# Patient Record
Sex: Female | Born: 1995
Health system: Southern US, Community
[De-identification: ages and names within clinical notes are randomized; demographics above are authoritative.]

## PROBLEM LIST (undated history)

## (undated) DIAGNOSIS — Z789 Other specified health status: Secondary | ICD-10-CM

## (undated) HISTORY — DX: Other specified health status: Z78.9

## (undated) HISTORY — PX: WISDOM TOOTH EXTRACTION: SHX21

---

## 2015-08-17 ENCOUNTER — Emergency Department
Admission: EM | Admit: 2015-08-17 | Discharge: 2015-08-17 | Disposition: A | Discharge status: Home / Self Care | Payer: No Typology Code available for payment source | Attending: Emergency Medicine | Admitting: Emergency Medicine

## 2015-08-17 ENCOUNTER — Emergency Department: Payer: No Typology Code available for payment source

## 2015-08-17 ENCOUNTER — Encounter: Payer: Self-pay | Admitting: *Deleted

## 2015-08-17 DIAGNOSIS — M545 Low back pain, unspecified: Secondary | ICD-10-CM

## 2015-08-17 DIAGNOSIS — Y998 Other external cause status: Secondary | ICD-10-CM | POA: Insufficient documentation

## 2015-08-17 DIAGNOSIS — Y92410 Unspecified street and highway as the place of occurrence of the external cause: Secondary | ICD-10-CM | POA: Insufficient documentation

## 2015-08-17 DIAGNOSIS — Y9389 Activity, other specified: Secondary | ICD-10-CM | POA: Insufficient documentation

## 2015-08-17 DIAGNOSIS — Z3202 Encounter for pregnancy test, result negative: Secondary | ICD-10-CM | POA: Insufficient documentation

## 2015-08-17 DIAGNOSIS — S3992XA Unspecified injury of lower back, initial encounter: Secondary | ICD-10-CM | POA: Insufficient documentation

## 2015-08-17 DIAGNOSIS — M7918 Myalgia, other site: Secondary | ICD-10-CM

## 2015-08-17 LAB — POCT PREGNANCY, URINE: Preg Test, Ur: NEGATIVE

## 2015-08-17 MED ORDER — NAPROXEN 500 MG PO TABS: 500.0000 mg | ORAL_TABLET | Freq: Two times a day (BID) | ORAL | Status: AC

## 2015-08-17 MED ORDER — CYCLOBENZAPRINE HCL 5 MG PO TABS: 5.0000 mg | ORAL_TABLET | Freq: Three times a day (TID) | ORAL | Status: DC | PRN

## 2015-08-17 NOTE — Discharge Instructions (Signed)
Dolor de espalda en adultos  (Back Pain, Adult)  El dolor de espalda es muy frecuente en los adultos. La causa del dolor de espalda es rara vez peligrosa y el dolor a menudo mejora con el tiempo. Es posible que se desconozca la causa de esta afección. Algunas causas comunes son las siguientes:  · Distensión de los músculos o ligamentos que sostienen la columna vertebral.  · Desgaste (degeneración) de los discos vertebrales.  · Artritis.  · Lesiones directas en la espalda.  En muchas personas, el dolor de espalda es recurrente. Como rara vez es peligroso, las personas pueden aprender a manejar esta afección por sí mismas.  INSTRUCCIONES PARA EL CUIDADO EN EL HOGAR  Controle su dolor de espalda a fin de detectar algún cambio. Las siguientes indicaciones ayudarán a aliviar cualquier molestia que pueda sentir:  · Permanezca activo. Si permanece sentado o de pie en un mismo lugar durante mucho tiempo, se tensiona la espalda. No se siente, conduzca o permanezca de pie en un mismo lugar durante más de 30 minutos seguidos. Realice caminatas cortas en superficies planas tan pronto como le sea posible. Trate de caminar un poco más de tiempo cada día.  · Haga ejercicio regularmente como se lo haya indicado el médico. El ejercicio ayuda a que su espalda se cure más rápidamente. También ayuda a prevenir futuras lesiones al mantener los músculos fuertes y flexibles.  · No permanezca en la cama. Si hace reposo más de 1 a 2 días, puede demorar su recuperación.  · Preste atención a su cuerpo al inclinarse y levantarse. Las posiciones más cómodas son las que ejercen menos tensión en la espalda en recuperación. Siempre use técnicas apropiadas para levantar objetos, como por ejemplo:    Flexionar las rodillas.    Mantener la carga cerca del cuerpo.    No torcerse.  · Encuentre una posición cómoda para dormir. Use un colchón firme y recuéstese de costado con las rodillas ligeramente flexionadas. Si se recuesta sobre la espalda, coloque  una almohada debajo de las rodillas.  · Evite sentir ansiedad o estrés. El estrés aumenta la tensión muscular y puede empeorar el dolor de espalda. Es importante reconocer si se siente ansioso o estresado y aprender maneras de controlarlo, por ejemplo haciendo ejercicio.  · Tome los medicamentos solamente como se lo haya indicado el médico. Los medicamentos de venta libre para aliviar el dolor y la inflamación a menudo son los más eficaces. El médico puede recetarle relajantes musculares. Estos medicamentos ayudan a calmar el dolor de modo que pueda reanudar más rápidamente sus actividades normales y el ejercicio saludable.  · Aplique hielo sobre la zona lesionada.    Ponga el hielo en una bolsa plástica.    Coloque una toalla entre la piel y la bolsa de hielo.    Deje el hielo durante 20 minutos, 2 a 3 veces por día, durante los primeros 2 o 3 días. Después de eso, puede alternar el hielo y el calor para reducir el dolor y los espasmos.  · Mantenga un peso saludable. El exceso de peso ejerce presión adicional sobre la espalda y hace que resulte difícil mantener una buena postura.  SOLICITE ATENCIÓN MÉDICA SI:  · Siente un dolor que no se alivia con reposo o medicamentos.  · Siente mucho dolor que se extiende a las piernas o los glúteos.  · El dolor no mejora en una semana.  · Siente dolor por la noche.  · Pierde peso.  · Siente escalofríos o fiebre.  SOLICITE ATENCIÓN MÉDICA DE INMEDIATO SI:   ·   Tiene nuevos problemas para controlar la vejiga o los intestinos.  · Siente debilidad o adormecimiento inusuales en los brazos o en las piernas.  · Siente náuseas o vómitos.  · Siente dolor abdominal.  · Siente que va a desmayarse.     Esta información no tiene como fin reemplazar el consejo del médico. Asegúrese de hacerle al médico cualquier pregunta que tenga.     Document Released: 08/26/2005 Document Revised: 09/16/2014  Elsevier Interactive Patient Education ©2016 [employer]
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
## 2015-11-30 DIAGNOSIS — Z309 Encounter for contraceptive management, unspecified: Secondary | ICD-10-CM | POA: Insufficient documentation

## 2017-10-16 ENCOUNTER — Ambulatory Visit
Admission: RE | Admit: 2017-10-16 | Discharge: 2017-10-16 | Disposition: A | Discharge status: Home / Self Care | Payer: Commercial Managed Care - PPO | Source: Ambulatory Visit | Attending: Internal Medicine | Admitting: Internal Medicine

## 2017-10-16 ENCOUNTER — Other Ambulatory Visit: Payer: Self-pay | Admitting: Internal Medicine

## 2017-10-16 DIAGNOSIS — R109 Unspecified abdominal pain: Secondary | ICD-10-CM | POA: Diagnosis not present

## 2017-10-16 DIAGNOSIS — R93421 Abnormal radiologic findings on diagnostic imaging of right kidney: Secondary | ICD-10-CM | POA: Insufficient documentation

## 2017-10-16 DIAGNOSIS — R93422 Abnormal radiologic findings on diagnostic imaging of left kidney: Secondary | ICD-10-CM | POA: Insufficient documentation

## 2018-09-30 IMAGING — CT CT RENAL STONE PROTOCOL
3 of 4 series · 8 of 46 positions shown, 15 images · non-contrast
Comparison: None.

CLINICAL DATA: Left flank pain since [REDACTED].

EXAM:
CT ABDOMEN AND PELVIS WITHOUT CONTRAST
TECHNIQUE: Multidetector CT imaging of the abdomen and pelvis was performed
following the standard protocol without IV contrast.

[Series 5: lung bases · axial · 0.71mm/px · z∈[-516,-456]mm · 4 of 20 slices shown, 9 images]
[im 4/20  soft-tissue]
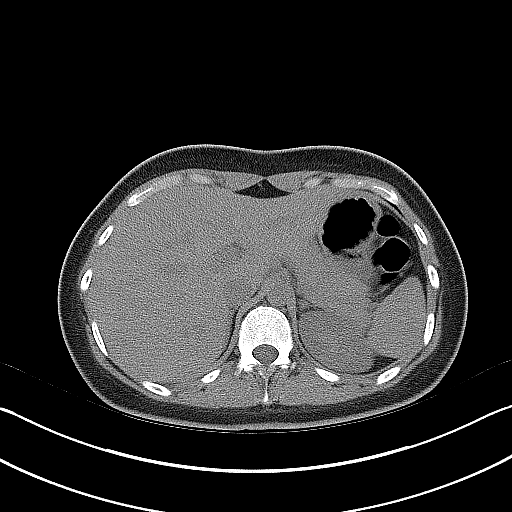
[im 4/20  lung]
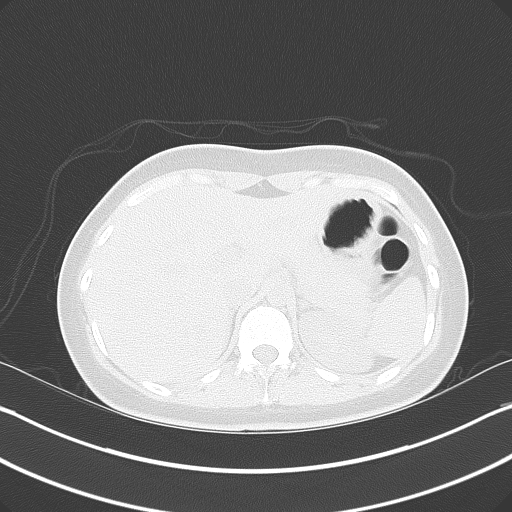
[im 4/20  bone]
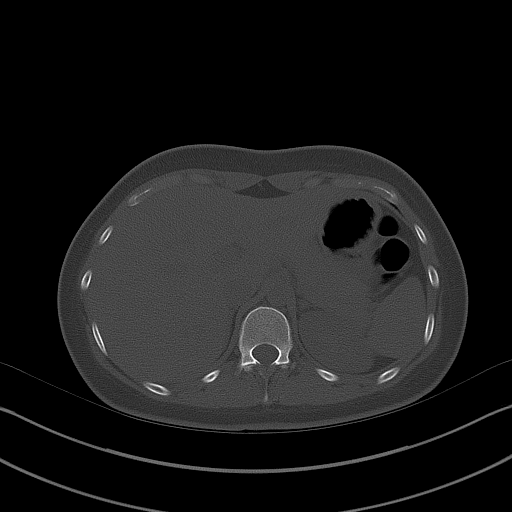
[im 8/20  soft-tissue]
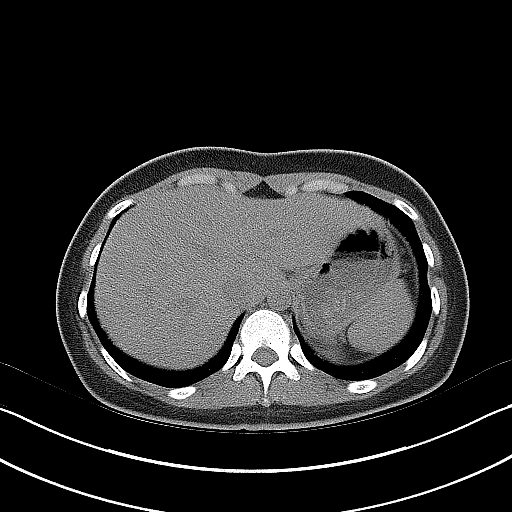
[im 8/20  lung]
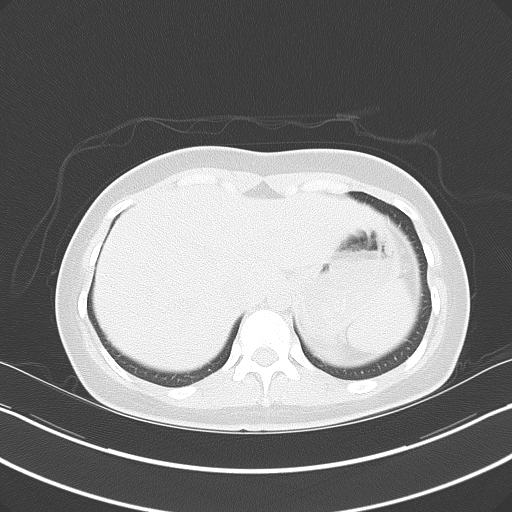
[im 12/20  soft-tissue]
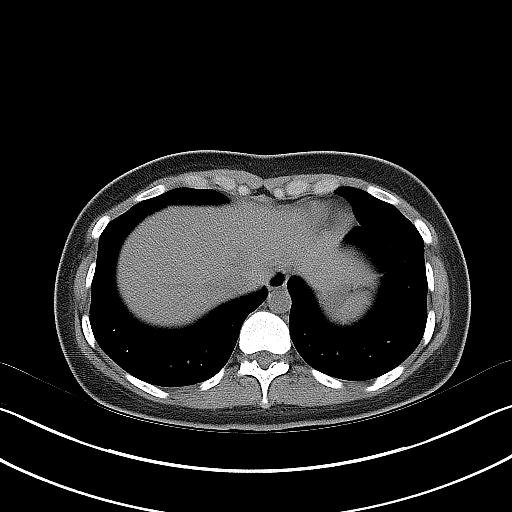
[im 12/20  lung]
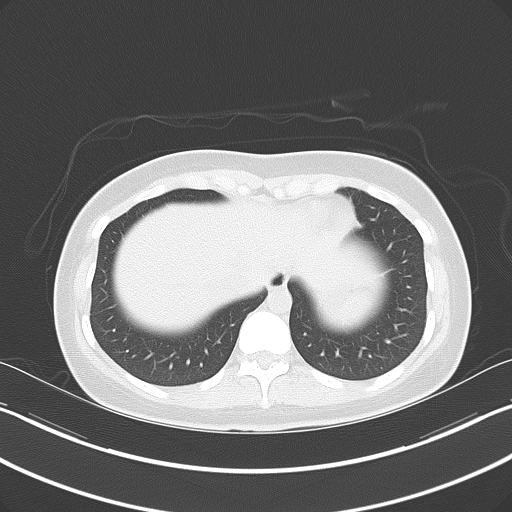
[im 16/20  soft-tissue]
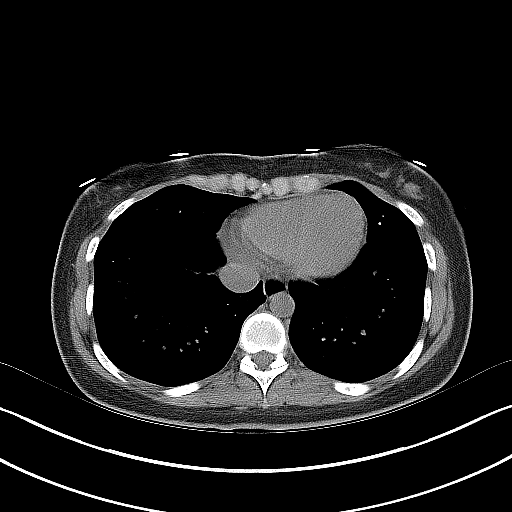
[im 16/20  lung]
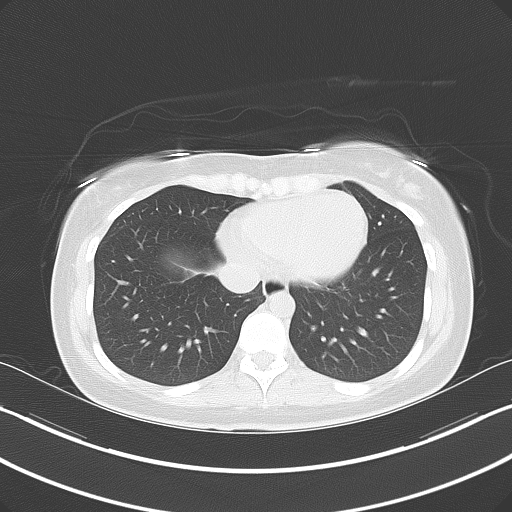

[Series 6: coronal · coronal · 0.74mm/px · 3 of 96 slices shown, 4 images]
[im 32/96  soft-tissue]
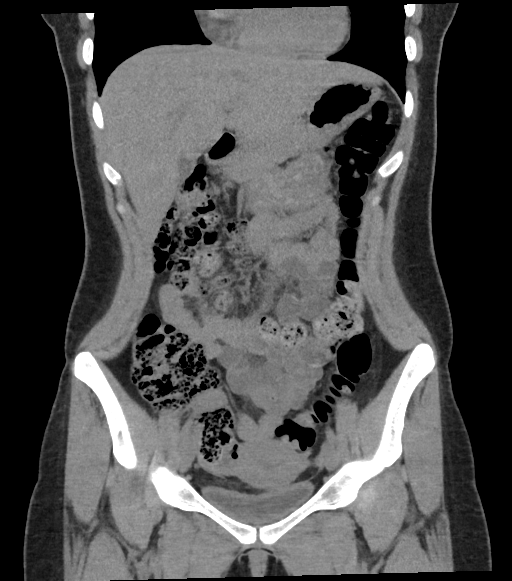
[im 43/96  soft-tissue]
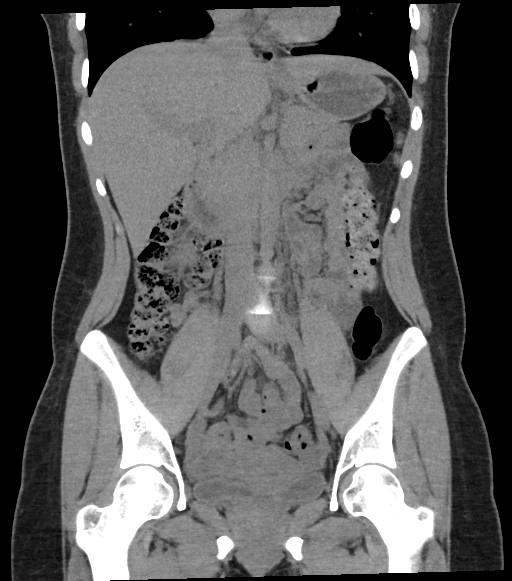
[im 43/96  bone]
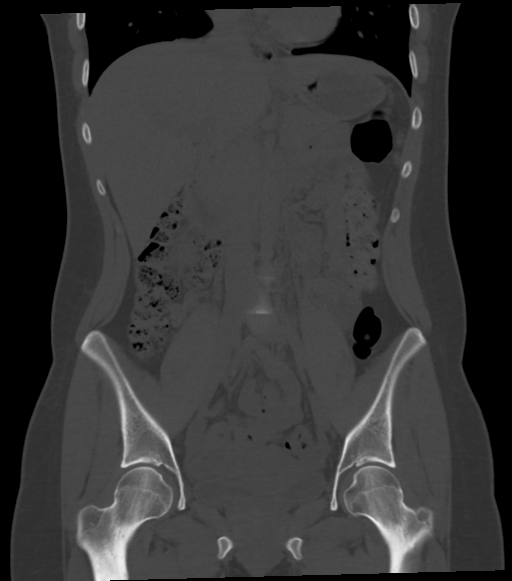
[im 53/96  soft-tissue]
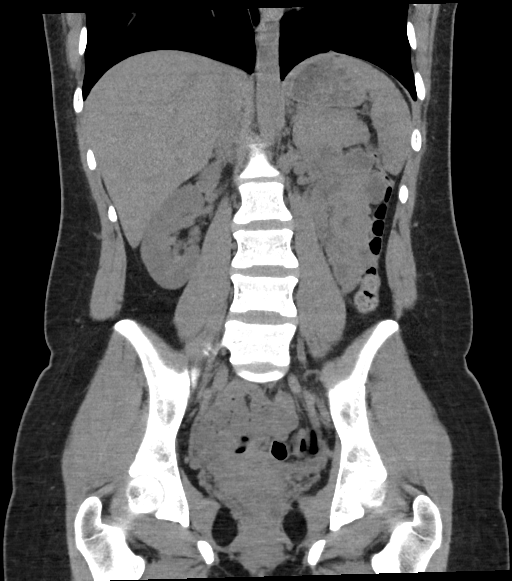

[Series 7: sagittal · sagittal · 0.42mm/px · 1 of 149 slices shown, 2 images]
[im 50/149  soft-tissue]
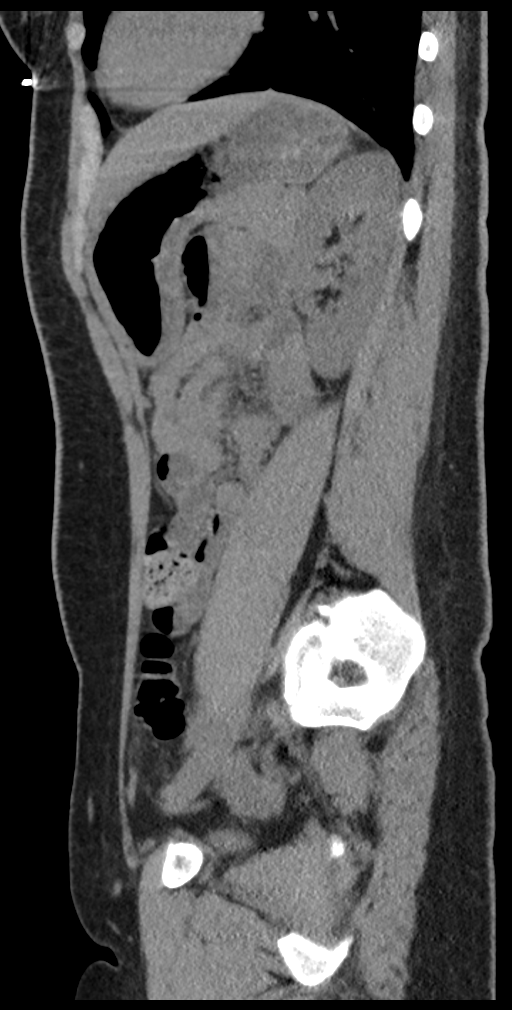
[im 50/149  bone]
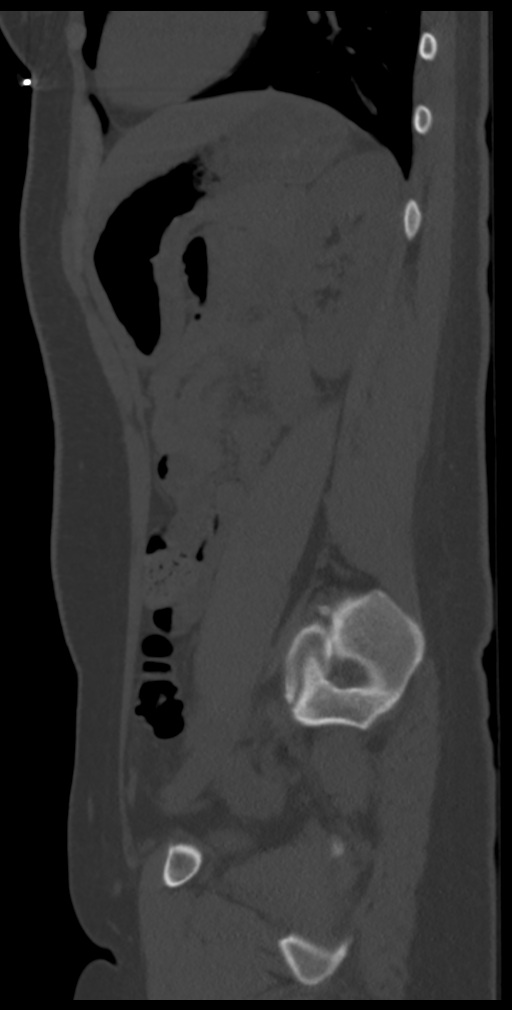

[8 of 46 positions shown; findings below may reference images not displayed]

FINDINGS: Lower chest: No acute abnormality.

Hepatobiliary: No focal liver abnormality is seen. No gallstones,
gallbladder wall thickening, or biliary dilatation.

Pancreas: Unremarkable. No pancreatic ductal dilatation or
surrounding inflammatory changes.

Spleen: Normal in size without focal abnormality.

Adrenals/Urinary Tract: Increased densities are identified in
bilateral renal medulla which may represent medullary calcinosis.
There is no hydronephrosis bilaterally. No focal renal stone is
identified in bilateral collecting systems. The bilateral adrenal
glands are normal. The bladder is normal.

Stomach/Bowel: Stomach is within normal limits. The appendix is not
seen but no inflammation around the cecum. No evidence of bowel wall
thickening, distention, or inflammatory changes.

Vascular/Lymphatic: No significant vascular findings are present. No
enlarged abdominal or pelvic lymph nodes.

Reproductive: Uterus and bilateral adnexa are unremarkable.

Other: None.

Musculoskeletal: No acute or significant osseous findings.
IMPRESSION: Increased densities are identified in bilateral renal medulla which
may represent medullary calcinosis. There is no hydronephrosis
bilaterally. No focal renal stone is identified in bilateral
collecting systems.

## 2020-07-04 ENCOUNTER — Ambulatory Visit (INDEPENDENT_AMBULATORY_CARE_PROVIDER_SITE_OTHER): Payer: Commercial Managed Care - PPO | Admitting: Obstetrics and Gynecology

## 2020-07-04 ENCOUNTER — Encounter: Payer: Self-pay | Admitting: Obstetrics and Gynecology

## 2020-07-04 ENCOUNTER — Other Ambulatory Visit (HOSPITAL_COMMUNITY)
Admission: RE | Admit: 2020-07-04 | Discharge: 2020-07-04 | Disposition: A | Discharge status: Home / Self Care | Payer: Commercial Managed Care - PPO | Source: Ambulatory Visit | Attending: Obstetrics and Gynecology | Admitting: Obstetrics and Gynecology

## 2020-07-04 ENCOUNTER — Other Ambulatory Visit: Payer: Self-pay

## 2020-07-04 VITALS — BP 108/70 | Ht 63.0 in | Wt 142.4 lb

## 2020-07-04 DIAGNOSIS — Z113 Encounter for screening for infections with a predominantly sexual mode of transmission: Secondary | ICD-10-CM | POA: Insufficient documentation

## 2020-07-04 DIAGNOSIS — Z0000 Encounter for general adult medical examination without abnormal findings: Secondary | ICD-10-CM | POA: Diagnosis not present

## 2020-07-04 DIAGNOSIS — Z01419 Encounter for gynecological examination (general) (routine) without abnormal findings: Secondary | ICD-10-CM

## 2020-07-04 NOTE — Progress Notes (Signed)
Gynecology Annual Exam  PCP: Leotis Shames, MD  Chief Complaint:  Chief Complaint  Patient presents with  . Gynecologic Exam    Annual Exam    History of Present Illness: Patient is a 24 y.o. No obstetric history on file. presents for annual exam. The patient has no complaints today.   LMP: Patient's last menstrual period was 06/09/2020. Average Interval: monthly Duration of flow: 5 days Heavy Menses: no Clots: no Intermenstrual Bleeding: no Postcoital Bleeding: reports spotting after intercourse one time.  Dysmenorrhea: no  The patient is sexually active. She currently uses OCP (estrogen/progesterone) for contraception. She denies dyspareunia.  The patient does perform self breast exams.  There is no notable family history of breast or ovarian cancer in her family.  The patient has regular exercise: cardio, 3 times a week.    The patient denies current symptoms of depression.    Review of Systems: Review of Systems  Constitutional: Negative for chills, fever, malaise/fatigue and weight loss.  HENT: Negative for congestion, hearing loss and sinus pain.   Eyes: Negative for blurred vision and double vision.  Respiratory: Negative for cough, sputum production, shortness of breath and wheezing.   Cardiovascular: Negative for chest pain, palpitations, orthopnea and leg swelling.  Gastrointestinal: Negative for abdominal pain, constipation, diarrhea, nausea and vomiting.  Genitourinary: Negative for dysuria, flank pain, frequency, hematuria and urgency.  Musculoskeletal: Negative for back pain, falls and joint pain.  Skin: Negative for itching and rash.  Neurological: Negative for dizziness and headaches.  Psychiatric/Behavioral: Negative for depression, substance abuse and suicidal ideas. The patient is not nervous/anxious.    Past Medical History:  There are no problems to display for this patient.  Past Surgical History:  History reviewed. No pertinent surgical  history.  Gynecologic History:  Patient's last menstrual period was 06/09/2020. Contraception: OCP (estrogen/progesterone) Last Pap: Results were: 2019  NIL   Obstetric History: No obstetric history on file.  Family History:  Family History  Problem Relation Age of Onset  . Hypertension Mother     Social History:  Social History   Socioeconomic History  . Marital status: Single    Spouse name: Not on file  . Number of children: Not on file  . Years of education: Not on file  . Highest education level: Not on file  Occupational History  . Not on file  Tobacco Use  . Smoking status: Never Smoker  . Smokeless tobacco: Never Used  Substance and Sexual Activity  . Alcohol use: Never  . Drug use: Never  . Sexual activity: Yes    Birth control/protection: Pill  Other Topics Concern  . Not on file  Social History Narrative  . Not on file   Social Determinants of Health   Financial Resource Strain:   . Difficulty of Paying Living Expenses: Not on file  Food Insecurity:   . Worried About Programme researcher, broadcasting/film/video in the Last Year: Not on file  . Ran Out of Food in the Last Year: Not on file  Transportation Needs:   . Lack of Transportation (Medical): Not on file  . Lack of Transportation (Non-Medical): Not on file  Physical Activity:   . Days of Exercise per Week: Not on file  . Minutes of Exercise per Session: Not on file  Stress:   . Feeling of Stress : Not on file  Social Connections:   . Frequency of Communication with Friends and Family: Not on file  . Frequency of Social Gatherings  with Friends and Family: Not on file  . Attends Religious Services: Not on file  . Active Member of Clubs or Organizations: Not on file  . Attends Banker Meetings: Not on file  . Marital Status: Not on file  Intimate Partner Violence:   . Fear of Current or Ex-Partner: Not on file  . Emotionally Abused: Not on file  . Physically Abused: Not on file  . Sexually Abused:  Not on file    Allergies:  No Known Allergies  Medications: Prior to Admission medications   Medication Sig Start Date End Date Taking? Authorizing Provider  Norgestimate-Ethinyl Estradiol Triphasic (TRI-LO-SPRINTEC) 0.18/0.215/0.25 MG-25 MCG tab Take 1 tablet by mouth daily. 04/17/20  Yes [provider]    Physical Exam Vitals: Blood pressure 108/70, height 5\' 3"  (1.6 m), weight 142 lb 6.4 oz (64.6 kg), last menstrual period 06/09/2020.  General: NAD HEENT: normocephalic, anicteric Thyroid: no enlargement, no palpable nodules Pulmonary: No increased work of breathing, CTAB Cardiovascular: RRR, distal pulses 2+ Breast: Declined Abdomen: NABS, soft, non-tender, non-distended.  Umbilicus without lesions.  No hepatomegaly, splenomegaly or masses palpable. No evidence of hernia  Genitourinary:  Declined Extremities: no edema, erythema, or tenderness Neurologic: Grossly intact Psychiatric: mood appropriate, affect full  Assessment: 24 y.o. No obstetric history on file. routine annual exam  Plan: Problem List Items Addressed This Visit    None    Visit Diagnoses    Encounter for annual routine gynecological examination    -  Primary   Health maintenance examination       Screen for STD (sexually transmitted disease)       Relevant Orders   Urine cytology ancillary only      1) STI screening  was offered and accepted  2) ASCCP guidelines and rational discussed.  Patient opts for every 3 years screening interval  3) Contraception - the patient is currently using  OCP (estrogen/progesterone).  She is happy with her current form of contraception and plans to continue  4) Routine healthcare maintenance including cholesterol, diabetes screening discussed managed by PCP  5) Return in about 1 year (around 07/04/2021) for annual.   07/06/2021 OB/GYN, [facility] 07/04/2020, 9:02 AM

## 2020-07-04 NOTE — Patient Instructions (Signed)
Exercising to Stay Healthy To become healthy and stay healthy, it is recommended that you do moderate-intensity and vigorous-intensity exercise. You can tell that you are exercising at a moderate intensity if your heart starts beating faster and you start breathing faster but can still hold a conversation. You can tell that you are exercising at a vigorous intensity if you are breathing much harder and faster and cannot hold a conversation while exercising. Exercising regularly is important. It has many health benefits, such as:  Improving overall fitness, flexibility, and endurance.  Increasing bone density.  Helping with weight control.  Decreasing body fat.  Increasing muscle strength.  Reducing stress and tension.  Improving overall health. How often should I exercise? Choose an activity that you enjoy, and set realistic goals. Your health care provider can help you make an activity plan that works for you. Exercise regularly as told by your health care provider. This may include:  Doing strength training two times a week, such as: ? Lifting weights. ? Using resistance bands. ? Push-ups. ? Sit-ups. ? Yoga.  Doing a certain intensity of exercise for a given amount of time. Choose from these options: ? A total of 150 minutes of moderate-intensity exercise every week. ? A total of 75 minutes of vigorous-intensity exercise every week. ? A mix of moderate-intensity and vigorous-intensity exercise every week. Children, pregnant women, people who have not exercised regularly, people who are overweight, and older adults may need to talk with a health care provider about what activities are safe to do. If you have a medical condition, be sure to talk with your health care provider before you start a new exercise program. What are some exercise ideas? Moderate-intensity exercise ideas include:  Walking 1 mile (1.6 km) in about 15  minutes.  Biking.  Hiking.  Golfing.  Dancing.  Water aerobics. Vigorous-intensity exercise ideas include:  Walking 4.5 miles (7.2 km) or more in about 1 hour.  Jogging or running 5 miles (8 km) in about 1 hour.  Biking 10 miles (16.1 km) or more in about 1 hour.  Lap swimming.  Roller-skating or in-line skating.  Cross-country skiing.  Vigorous competitive sports, such as football, basketball, and soccer.  Jumping rope.  Aerobic dancing. What are some everyday activities that can help me to get exercise?  Yard work, such as: ? Pushing a lawn mower. ? Raking and bagging leaves.  Washing your car.  Pushing a stroller.  Shoveling snow.  Gardening.  Washing windows or floors. How can I be more active in my day-to-day activities?  Use stairs instead of an elevator.  Take a walk during your lunch break.  If you drive, park your car farther away from your work or school.  If you take public transportation, get off one stop early and walk the rest of the way.  Stand up or walk around during all of your indoor phone calls.  Get up, stretch, and walk around every 30 minutes throughout the day.  Enjoy exercise with a friend. Support to continue exercising will help you keep a regular routine of activity. What guidelines can I follow while exercising?  Before you start a new exercise program, talk with your health care provider.  Do not exercise so much that you hurt yourself, feel dizzy, or get very short of breath.  Wear comfortable clothes and wear shoes with good support.  Drink plenty of water while you exercise to prevent dehydration or heat stroke.  Work out until your breathing   and your heartbeat get faster. Where to find more information  [place] Department of Health and Human Services: www.hhs.gov  Centers for Disease Control and Prevention (CDC): www.cdc.gov Summary  Exercising regularly is important. It will improve your overall fitness,  flexibility, and endurance.  Regular exercise also will improve your overall health. It can help you control your weight, reduce stress, and improve your bone density.  Do not exercise so much that you hurt yourself, feel dizzy, or get very short of breath.  Before you start a new exercise program, talk with your health care provider. This information is not intended to replace advice given to you by your health care provider. Make sure you discuss any questions you have with your health care provider. Document Revised: 08/08/2017 Document Reviewed: 07/17/2017 Elsevier Patient Education  2020 [employer]   Budget-Friendly Healthy Eating There are many ways to save money at the grocery store and continue to eat healthy. You can be successful if you:  Plan meals according to your budget.  Make a grocery list and only purchase food according to your grocery list.  Prepare food yourself. What are tips for following this plan?  Reading food labels  Compare food labels between brand name foods and the store brand. Often the nutritional value is the same, but the store brand is lower cost.  Look for products that do not have added sugar, fat, or salt (sodium). These often cost the same but are healthier for you. Products may be labeled as: ? Sugar-free. ? Nonfat. ? Low-fat. ? Sodium-free. ? Low-sodium.  Look for lean ground beef labeled as at least 92% lean and 8% fat. Shopping  Buy only the items on your grocery list and go only to the areas of the store that have the items on your list.  Use coupons only for foods and brands you normally buy. Avoid buying items you wouldn't normally buy simply because they are on sale.  Check online and in newspapers for weekly deals.  Buy healthy items from the bulk bins when available, such as herbs, spices, flour, pasta, nuts, and dried fruit.  Buy fruits and vegetables that are in season. Prices are usually lower on in-season  produce.  Look at the unit price on the price tag. Use it to compare different brands and sizes to find out which item is the best deal.  Choose healthy items that are often low-cost, such as carrots, potatoes, apples, bananas, and oranges. Dried or canned beans are a low-cost protein source.  Buy in bulk and freeze extra food. Items you can buy in bulk include meats, fish, poultry, frozen fruits, and frozen vegetables.  Avoid buying "ready-to-eat" foods, such as pre-cut fruits and vegetables and pre-made salads.  If possible, shop around to discover where you can find the best prices. Consider other retailers such as dollar stores, larger wholesale stores, local fruit and vegetable stands, and farmers markets.  Do not shop when you are hungry. If you shop while hungry, it may be hard to stick to your list and budget.  Resist impulse buying. Use your grocery list as your official plan for the week.  Buy a variety of vegetables and fruits by purchasing fresh, frozen, and canned items.  Look at the top and bottom shelves for deals. Foods at eye level (eye level of an adult or child) are usually more expensive.  Be efficient with your time when shopping. The more time you spend at the store, the more money you   are likely to spend.  To save money when choosing more expensive foods like meats and dairy: ? Choose cheaper cuts of meat, such as bone-in chicken thighs and drumsticks instead of skinless and boneless chicken. When you are ready to prepare the chicken, you can remove the skin yourself to make it healthier. ? Choose lean meats like chicken or turkey instead of beef. ? Choose canned seafood, such as tuna, salmon, or sardines. ? Buy eggs as a low-cost source of protein. ? Buy dried beans and peas, such as lentils, split peas, or kidney beans instead of meats. Dried beans and peas are a good alternative source of protein. ? Buy the larger tubs of yogurt instead of individual-sized  containers.  Choose water instead of sodas and other sweetened beverages.  Avoid buying chips, cookies, and other "junk food." These items are usually expensive and not healthy. Cooking  Make extra food and freeze the extras in meal-sized containers or in individual portions for fast meals and snacks.  Pre-cook on days when you have extra time to prepare meals in advance. You can keep these meals in the fridge or freezer and reheat for a quick meal.  When you come home from the grocery store, wash, peel, and cut fruits and vegetables so they are ready to use and eat. This will help reduce food waste. Meal planning  Do not eat out or get fast food. Prepare food at home.  Make a grocery list and make sure to bring it with you to the store. If you have a smart phone, you could use your phone to create your shopping list.  Plan meals and snacks according to a grocery list and budget you create.  Use leftovers in your meal plan for the week.  Look for recipes where you can cook once and make enough food for two meals.  Include budget-friendly meals like stews, casseroles, and stir-fry dishes.  Try some meatless meals or try "no cook" meals like salads.  Make sure that half your plate is filled with fruits or vegetables. Choose from fresh, frozen, or canned fruits and vegetables. If eating canned, remember to rinse them before eating. This will remove any excess salt added for packaging. Summary  Eating healthy on a budget is possible if you plan your meals according to your budget, purchase according to your budget and grocery list, and prepare food yourself.  Tips for buying more food on a limited budget include buying generic brands, using coupons only for foods you normally buy, and buying healthy items from the bulk bins when available.  Tips for buying cheaper food to replace expensive food include choosing cheaper, lean cuts of meat, and buying dried beans and peas. This  information is not intended to replace advice given to you by your health care provider. Make sure you discuss any questions you have with your health care provider. Document Revised: 08/27/2017 Document Reviewed: 08/27/2017 Elsevier Patient Education  2020 [employer]   Bone Health Bones protect organs, store calcium, anchor muscles, and support the whole body. Keeping your bones strong is important, especially as you get older. You can take actions to help keep your bones strong and healthy. Why is keeping my bones healthy important?  Keeping your bones healthy is important because your body constantly replaces bone cells. Cells get old, and new cells take their place. As we age, we lose bone cells because the body may not be able to make enough new cells to replace the   old cells. The amount of bone cells and bone tissue you have is referred to as bone mass. The higher your bone mass, the stronger your bones. The aging process leads to an overall loss of bone mass in the body, which can increase the likelihood of:  Joint pain and stiffness.  Broken bones.  A condition in which the bones become weak and brittle (osteoporosis). A large decline in bone mass occurs in older adults. In women, it occurs about the time of menopause. What actions can I take to keep my bones healthy? Good health habits are important for maintaining healthy bones. This includes eating nutritious foods and exercising regularly. To have healthy bones, you need to get enough of the right minerals and vitamins. Most nutrition experts recommend getting these nutrients from the foods that you eat. In some cases, taking supplements may also be recommended. Doing certain types of exercise is also important for bone health. What are the nutritional recommendations for healthy bones?  Eating a well-balanced diet with plenty of calcium and vitamin D will help to protect your bones. Nutritional recommendations vary from person  to person. Ask your health care provider what is healthy for you. Here are some general guidelines. Get enough calcium Calcium is the most important (essential) mineral for bone health. Most people can get enough calcium from their diet, but supplements may be recommended for people who are at risk for osteoporosis. Good sources of calcium include:  Dairy products, such as low-fat or nonfat milk, cheese, and yogurt.  Dark green leafy vegetables, such as bok choy and broccoli.  Calcium-fortified foods, such as orange juice, cereal, bread, soy beverages, and tofu products.  Nuts, such as almonds. Follow these recommended amounts for daily calcium intake:  Children, age 1-3: 700 mg.  Children, age 4-8: 1,000 mg.  Children, age 9-13: 1,300 mg.  Teens, age 14-18: 1,300 mg.  Adults, age 19-50: 1,000 mg.  Adults, age 51-70: ? Men: 1,000 mg. ? Women: 1,200 mg.  Adults, age 71 or older: 1,200 mg.  Pregnant and breastfeeding females: ? Teens: 1,300 mg. ? Adults: 1,000 mg. Get enough vitamin D Vitamin D is the most essential vitamin for bone health. It helps the body absorb calcium. Sunlight stimulates the skin to make vitamin D, so be sure to get enough sunlight. If you live in a cold climate or you do not get outside often, your health care provider may recommend that you take vitamin D supplements. Good sources of vitamin D in your diet include:  Egg yolks.  Saltwater fish.  Milk and cereal fortified with vitamin D. Follow these recommended amounts for daily vitamin D intake:  Children and teens, age 1-18: 600 international units.  Adults, age 50 or younger: 400-800 international units.  Adults, age 51 or older: 800-1,000 international units. Get other important nutrients Other nutrients that are important for bone health include:  Phosphorus. This mineral is found in meat, poultry, dairy foods, nuts, and legumes. The recommended daily intake for adult men and adult women is  700 mg.  Magnesium. This mineral is found in seeds, nuts, dark green vegetables, and legumes. The recommended daily intake for adult men is 400-420 mg. For adult women, it is 310-320 mg.  Vitamin K. This vitamin is found in green leafy vegetables. The recommended daily intake is 120 mg for adult men and 90 mg for adult women. What type of physical activity is best for building and maintaining healthy bones? Weight-bearing and strength-building activities are   important for building and maintaining healthy bones. Weight-bearing activities cause muscles and bones to work against gravity. Strength-building activities increase the strength of the muscles that support bones. Weight-bearing and muscle-building activities include:  Walking and hiking.  Jogging and running.  Dancing.  Gym exercises.  Lifting weights.  Tennis and racquetball.  Climbing stairs.  Aerobics. Adults should get at least 30 minutes of moderate physical activity on most days. Children should get at least 60 minutes of moderate physical activity on most days. Ask your health care provider what type of exercise is best for you. How can I find out if my bone mass is low? Bone mass can be measured with an X-ray test called a bone mineral density (BMD) test. This test is recommended for all women who are age 65 or older. It may also be recommended for:  Men who are age 70 or older.  People who are at risk for osteoporosis because of: ? Having bones that break easily. ? Having a long-term disease that weakens bones, such as kidney disease or rheumatoid arthritis. ? Having menopause earlier than normal. ? Taking medicine that weakens bones, such as steroids, thyroid hormones, or hormone treatment for breast cancer or prostate cancer. ? Smoking. ? Drinking three or more alcoholic drinks a day. If you find that you have a low bone mass, you may be able to prevent osteoporosis or further bone loss by changing your diet and  lifestyle. Where can I find more information? For more information, check out the following websites:  National Osteoporosis Foundation: www.nof.org/patients  National Institutes of Health: www.bones.nih.gov  International Osteoporosis Foundation: www.iofbonehealth.org Summary  The aging process leads to an overall loss of bone mass in the body, which can increase the likelihood of broken bones and osteoporosis.  Eating a well-balanced diet with plenty of calcium and vitamin D will help to protect your bones.  Weight-bearing and strength-building activities are also important for building and maintaining strong bones.  Bone mass can be measured with an X-ray test called a bone mineral density (BMD) test. This information is not intended to replace advice given to you by your health care provider. Make sure you discuss any questions you have with your health care provider. Document Revised: 09/22/2017 Document Reviewed: 09/22/2017 Elsevier Patient Education  2020 [employer]   

## 2020-07-04 NOTE — Progress Notes (Signed)
Pt here for her Annual Exam.

## 2020-07-05 LAB — URINE CYTOLOGY ANCILLARY ONLY
Chlamydia: NEGATIVE
Comment: NEGATIVE
Comment: NORMAL
Neisseria Gonorrhea: NEGATIVE

## 2021-05-15 ENCOUNTER — Other Ambulatory Visit: Payer: Self-pay | Admitting: Obstetrics and Gynecology

## 2021-05-15 DIAGNOSIS — O219 Vomiting of pregnancy, unspecified: Secondary | ICD-10-CM

## 2021-05-15 MED ORDER — ONDANSETRON 4 MG PO TBDP: 4.0000 mg | ORAL_TABLET | Freq: Four times a day (QID) | ORAL | 3 refills | Status: DC | PRN

## 2021-06-04 ENCOUNTER — Encounter: Payer: Self-pay | Admitting: Obstetrics and Gynecology

## 2021-06-04 ENCOUNTER — Other Ambulatory Visit (HOSPITAL_COMMUNITY)
Admission: RE | Admit: 2021-06-04 | Discharge: 2021-06-04 | Disposition: A | Discharge status: Home / Self Care | Payer: Commercial Managed Care - PPO | Source: Ambulatory Visit | Attending: Obstetrics and Gynecology | Admitting: Obstetrics and Gynecology

## 2021-06-04 ENCOUNTER — Other Ambulatory Visit: Payer: Self-pay

## 2021-06-04 ENCOUNTER — Ambulatory Visit (INDEPENDENT_AMBULATORY_CARE_PROVIDER_SITE_OTHER): Payer: Commercial Managed Care - PPO | Admitting: Obstetrics and Gynecology

## 2021-06-04 VITALS — BP 112/70 | Wt 141.8 lb

## 2021-06-04 DIAGNOSIS — N926 Irregular menstruation, unspecified: Secondary | ICD-10-CM

## 2021-06-04 DIAGNOSIS — Z1379 Encounter for other screening for genetic and chromosomal anomalies: Secondary | ICD-10-CM

## 2021-06-04 DIAGNOSIS — Z1371 Encounter for nonprocreative screening for genetic disease carrier status: Secondary | ICD-10-CM

## 2021-06-04 DIAGNOSIS — Z3401 Encounter for supervision of normal first pregnancy, first trimester: Secondary | ICD-10-CM | POA: Diagnosis present

## 2021-06-04 DIAGNOSIS — Z124 Encounter for screening for malignant neoplasm of cervix: Secondary | ICD-10-CM | POA: Diagnosis present

## 2021-06-04 DIAGNOSIS — K219 Gastro-esophageal reflux disease without esophagitis: Secondary | ICD-10-CM | POA: Diagnosis not present

## 2021-06-04 DIAGNOSIS — Z3400 Encounter for supervision of normal first pregnancy, unspecified trimester: Secondary | ICD-10-CM | POA: Insufficient documentation

## 2021-06-04 DIAGNOSIS — R875 Abnormal microbiological findings in specimens from female genital organs: Secondary | ICD-10-CM

## 2021-06-04 DIAGNOSIS — Z13228 Encounter for screening for other metabolic disorders: Secondary | ICD-10-CM

## 2021-06-04 LAB — POCT URINALYSIS DIPSTICK OB
Glucose, UA: NEGATIVE
POC,PROTEIN,UA: NEGATIVE

## 2021-06-04 LAB — POCT URINE PREGNANCY: Preg Test, Ur: POSITIVE — AB

## 2021-06-04 MED ORDER — SUCRALFATE 1 G PO TABS: 1.0000 g | ORAL_TABLET | Freq: Four times a day (QID) | ORAL | 1 refills | Status: DC

## 2021-06-04 NOTE — Progress Notes (Signed)
06/04/2021   Chief Complaint: Missed period  Transfer of Care Patient: no  History of Present Illness: [name] is a 25 y.o. G1P0 Unknown based on No LMP recorded (lmp unknown). Patient is pregnant. with an Estimated Date of Delivery: None noted., with the above CC.   Her periods were: monthly  She has Positive signs or symptoms of nausea/vomiting of pregnancy. She has Negative signs or symptoms of miscarriage or preterm labor She was taking different medications around the time she conceived/early pregnancy. Since her LMP, she has used alcohol Since her LMP, she has not used tobacco products Since her LMP, she has not used illegal drugs.    Current or past history of domestic violence. no  Infection History:  1. Since her LMP, she has not had a viral illness.  2. She denies close contact with children on a regular basis     3. She has a history of chicken pox. She reports vaccination for chicken pox in the past. 4. Patient or partner has history of genital herpes  no 5. History of STI (GC, CT, HPV, syphilis, HIV)  no   6.  She does not live with someone with TB or TB exposed. 7. History of recent travel :  no 8. She identifies Negative Zika risk factors for her and her partner 51. There are not cats in the home in the home.  She understands that while pregnant she should not change cat litter.   Genetic Screening Questions: (Includes patient, baby's father, or anyone in either family)   1. Patient's age >/= 59 at [facility]  no 2. Thalassemia ([place], [place], Mediterranean, or Asian background): MCV<80  no 3. Neural tube defect (meningomyelocele, spina bifida, anencephaly)  no 4. Congenital heart defect  no  5. Down syndrome  no 6. Tay-Sachs (Jewish, [place]  no 7. Canavan's Disease  no 8. Sickle cell disease or trait (African)  no  9. Hemophilia or other blood disorders  no  10. Muscular dystrophy  no  11. Cystic fibrosis  no  12. Huntington's Chorea  no   13. Mental retardation/autism  no 14. Other inherited genetic or chromosomal disorder  no 15. Maternal metabolic disorder (DM, PKU, etc)  no 16. Patient or FOB with a child with a birth defect not listed above no  16a. Patient or FOB with a birth defect themselves no 17. Recurrent pregnancy loss, or stillbirth  no  18. Any medications since LMP other than prenatal vitamins (include vitamins, supplements, OTC meds, drugs, alcohol)  no 19. Any other genetic/environmental exposure to discuss  no  ROS:  Review of Systems  Constitutional:  Negative for chills, fever, malaise/fatigue and weight loss.  HENT:  Negative for congestion, hearing loss and sinus pain.   Eyes:  Negative for blurred vision and double vision.  Respiratory:  Negative for cough, sputum production, shortness of breath and wheezing.   Cardiovascular:  Negative for chest pain, palpitations, orthopnea and leg swelling.  Gastrointestinal:  Positive for heartburn, nausea and vomiting. Negative for abdominal pain, constipation and diarrhea.  Genitourinary:  Negative for dysuria, flank pain, frequency, hematuria and urgency.  Musculoskeletal:  Negative for back pain, falls and joint pain.  Skin:  Negative for itching and rash.  Neurological:  Negative for dizziness and headaches.  Psychiatric/Behavioral:  Negative for depression, substance abuse and suicidal ideas. The patient is not nervous/anxious.    OBGYN History: As per HPI. OB History  Gravida Para Term Preterm AB Living  1  SAB IAB Ectopic Multiple Live Births               # Outcome Date GA Lbr Len/2nd Weight Sex Delivery Anes PTL Lv  1 Current             Any issues with any prior pregnancies: not applicable Any prior children are healthy, doing well, without any problems or issues: no Last pap smear unknown History of STIs: No   Past Medical History: No past medical history on file.  Past Surgical History: No past surgical history on  file.  Family History:  Family History  Problem Relation Age of Onset   Hypertension Mother    She denies any female cancers, bleeding or blood clotting disorders.   Social History:  Social History   Socioeconomic History   Marital status: Married    Spouse name: Not on file   Number of children: Not on file   Years of education: Not on file   Highest education level: Not on file  Occupational History   Not on file  Tobacco Use   Smoking status: Never   Smokeless tobacco: Never  Substance and Sexual Activity   Alcohol use: Never   Drug use: Never   Sexual activity: Yes    Birth control/protection: Pill  Other Topics Concern   Not on file  Social History Narrative   Not on file   Social Determinants of Health   Financial Resource Strain: Not on file  Food Insecurity: Not on file  Transportation Needs: Not on file  Physical Activity: Not on file  Stress: Not on file  Social Connections: Not on file  Intimate Partner Violence: Not on file    Allergy: No Known Allergies  Current Outpatient Medications:  Current Outpatient Medications:    ondansetron (ZOFRAN ODT) 4 MG disintegrating tablet, Take 1 tablet (4 mg total) by mouth every 6 (six) hours as needed for nausea., Disp: 120 tablet, Rfl: 3   Prenatal Vit-Fe Fumarate-FA (PRENATAL MULTIVITAMIN) TABS tablet, Take 1 tablet by mouth daily at 12 noon., Disp: , Rfl:    Norgestimate-Ethinyl Estradiol Triphasic 0.18/0.215/0.25 MG-25 MCG tab, Take 1 tablet by mouth daily. (Patient not taking: Reported on 06/04/2021), Disp: , Rfl:    Physical Exam: Physical Exam Vitals and nursing note reviewed.  HENT:     Head: Normocephalic and atraumatic.  Eyes:     Pupils: Pupils are equal, round, and reactive to light.  Neck:     Thyroid: No thyromegaly.  Cardiovascular:     Rate and Rhythm: Normal rate and regular rhythm.  Pulmonary:     Effort: Pulmonary effort is normal.  Abdominal:     General: Bowel sounds are normal.  There is no distension.     Palpations: Abdomen is soft.     Tenderness: There is no abdominal tenderness. There is no guarding or rebound.  Genitourinary:    Comments: External: Normal appearing vulva. No lesions noted.  Speculum examination: Normal appearing cervix. No blood in the vaginal vault. Thick white discharge.   Musculoskeletal:        General: Normal range of motion.     Cervical back: Normal range of motion and neck supple.  Skin:    General: Skin is warm and dry.  Neurological:     Mental Status: She is alert and oriented to person, place, and time.  Psychiatric:        Mood and Affect: Affect normal.        Judgment:  Judgment normal.     Assessment: [name] is a 25 y.o. G1P0 Unknown based on No LMP recorded (lmp unknown). Patient is pregnant. with an Estimated Date of Delivery: None noted.,  for prenatal care.  Plan:  1) Avoid alcoholic beverages. 2) Patient encouraged not to smoke.  3) Discontinue the use of all non-medicinal drugs and chemicals.  4) Take prenatal vitamins daily.  5) Seatbelt use advised 6) Nutrition, food safety (fish, cheese advisories, and high nitrite foods) and exercise discussed. 7) Hospital and practice style delivering at [facility] discussed  8) Patient is asked about travel to areas at risk for the Zika virus, and counseled to avoid travel and exposure to mosquitoes or sexual partners who may have themselves been exposed to the virus. Testing is discussed, and will be ordered as appropriate.  9) Childbirth classes at [facility] advised 10) Genetic Screening, such as with 1st Trimester Screening, cell free fetal DNA, AFP testing, and Ultrasound, as well as with amniocentesis and CVS as appropriate, is discussed with patient. She plans to have genetic testing this pregnancy.  NOB labs today  Dating TVUS today  FHR: 163 bpm, IUP with CRL 10 weeks 6 days.     Problem list reviewed and updated.  I discussed the assessment and treatment  plan with the patient. The patient was provided an opportunity to ask questions and all were answered. The patient agreed with the plan and demonstrated an understanding of the instructions.  Adelene Idler MD [facility], [facility] 06/04/2021 2:14 PM

## 2021-06-04 NOTE — Patient Instructions (Addendum)
OVER THE COUNTER MEDICINES  SAFE IN PREGNANCY   COMPLAINT                                        MEDICINE                                                 Constipation Add fiber supplements such as Metamucil, Fibercon, or Citrucel.  Increasing fiber intake via bran cereals, oatmeal, leafy greens, and prunes is another alternative.  Increase you fluid intake.  You may also add Colace 135m by mouth twice daily or Senakot   Cough/Cold      Robitusin, Mucinex  Cuts and  Scrapes Bacitracin, Neosporin, Plysporin  Dental Pain     Tylenol. Avoid aspirin  products, which  could cause bleeding problems for mother and baby.  Also avoid ibuprofen products, such as Advil, Motrin or Aleve unless your doctor or nurse specifically recommends these drugs.  Diarrhea Immodium, Kaopectate, or Donnagel PG .  Ensure adequate hydration by taking in plenty of clear liquids such as Gatorade, ginger ale, and jello.  Call if symptoms persist over 24-hrs.  Do NOT take Pepto-Bismol  Fever Take Tylenol *, Extra-Strength Tylenol *, or any aspirin-free pain reliever (acetaminophen).  Avoid aspirin products, which could cause bleeding problems for mother and baby.  Also avoid ibuprofen products, such as Advil, Motrin or Aleve unless your doctor or nurse specifically recommends these drugs.  If you temperature is over 101o F (38.3o C), call the  clinic  Gas    Gaviscon,, Mylicon,  Riopan    Headache   Tylenol. Avoid aspirin                                                                         products, which  could cause bleeding problems for mother and baby.  Also avoid ibuprofen products, such as Advil, Motrin or Aleve unless your doctor or nurse specifically recommends these drugs.  Head Lice     Nix  Heartburn/Indigestion TUMS, Rolaids, Zantac, Mylanta. Maalox   Avoid fatty, fried, or spicy foods.  Eat small frequent meals 5-6 times daily as opposed to 3 large meals a  day.  Hemorrhoids Annusol HC, Tucks , Perperation H , or Dermaplast.  Warm sitz baths for 30 minutes twice daily.  Air dry and sleep without underwear.  Avoid constipation and see recommendation above for constipation as this may exacerbate/worsen you hemorrhoids  Leg Cramps     TUMS, Vitamin with Potassium  Leg Swelling  Elevate legs above  waist, lay on your left side.  Well fitting  shoes and support  hose  Muscle Aches                Tylenol. Avoid aspirin products, which  could cause bleeding problems for mother and baby.  Also avoid ibuprofen products, such as Advil, Motrin or Aleve unless your doctor or nurse specifically recommends these drugs.  Nausea/Vomiting Emetrol.  Supportive measures to ensure you stay adequately hydrated.  Sips of juice, Gatorade, ginger ale 4 times an hour.  If you are unable to tolerate fluid intake for greater than 8hrs call the clinic.  You may want to allow ginger ale to go flat as carbonation my precipitate emesis.  Over the counter chewable prenatal vitamins are available.  Vitamin B6 (pyridoxine) 10 to  every 8hrs and doxylamine (Unisome sleep tabs)  at bedtime and 12.5mg  in the morning is first line.  Note that B6 supplements are not FDA regulated, there is a prescription of the above combination commercially available called Bonjesta.  Nosebleeds Apply ice pack to nose.  Particularly in winter time with dryer air, consider the use of a humidifier   Painful urination    Call clinic  Rash/ Itch Benadryl, Aveeno, Cortaid.  For severe whole body itching in the late second early third trimester call the office  Sleep Problems    Benadryl, Tylenol  PM, or Unisom  Seasonal Allergies    Claritin, Zyrtec,          Benadryl, Mucinex  Sinus Congestion Tylenol with Sudafed (pseudoephedrine no phenylephrine) or Actifed.  Sore  Throat Chloroseptic lozenges and sprays such as Cepacol.  Salt water gargles (1 teaspoon of table salt in one quart of water)  Call if associated temperature above 101 Fahrenheit or 38.3 Celsius  Yeast Infection     Monistat    First Trimester of Pregnancy The first trimester of pregnancy starts on the first day of your last menstrual period until the end of week 12. This is months 1 through 3 of pregnancy. A week after a sperm fertilizes an egg, the egg will implant into the wall of the uterus and begin to develop into a baby. By the end of 12 weeks, all the baby's organs will be formed and the baby will be 2-3 inches in size. Body changes during your first trimester Your body goes through many changes during pregnancy. The changes vary and generally return to normal after your baby is born. Physical changes You may gain or lose weight. Your breasts may begin to grow larger and become tender. The tissue that surrounds your nipples (areola) may become darker. Dark spots or blotches (chloasma or mask of pregnancy) may develop on your face. You may have changes in your hair. These can include thickening or thinning of your hair or changes in texture. Health changes You may feel nauseous, and you may vomit. You may have heartburn. You may develop headaches. You may develop constipation. Your gums may bleed and may be sensitive to brushing and flossing. Other changes You may tire easily. You may urinate more often. Your menstrual periods will stop. You may have a loss of appetite. You may develop cravings for certain kinds of food. You may have changes in your emotions from day to day. You may have more vivid and strange dreams. Follow these instructions at home: Medicines Follow your health care provider's instructions regarding medicine use. Specific medicines may be either safe or unsafe to take during pregnancy. Do  not take any medicines unless told to by your health care provider. Take  a prenatal vitamin that contains at least 600 micrograms (mcg) of folic acid. Eating and drinking Eat a healthy diet that includes fresh fruits and vegetables, whole grains, good sources of protein such as meat, eggs, or tofu, and low-fat dairy products. Avoid raw meat and unpasteurized juice, milk, and cheese. These carry germs that can harm you and your baby. If you feel nauseous or you vomit: Eat 4 or 5 small meals a day instead of 3 large meals. Try eating a few soda crackers. Drink liquids between meals instead of during meals. You may need to take these actions to prevent or treat constipation: Drink enough fluid to keep your urine pale yellow. Eat foods that are high in fiber, such as beans, whole grains, and fresh fruits and vegetables. Limit foods that are high in fat and processed sugars, such as fried or sweet foods. Activity Exercise only as directed by your health care provider. Most people can continue their usual exercise routine during pregnancy. Try to exercise for 30 minutes at least 5 days a week. Stop exercising if you develop pain or cramping in the lower abdomen or lower back. Avoid exercising if it is very hot or humid or if you are at high altitude. Avoid heavy lifting. If you choose to, you may have sex unless your health care provider tells you not to. Relieving pain and discomfort Wear a good support bra to relieve breast tenderness. Rest with your legs elevated if you have leg cramps or low back pain. If you develop bulging veins (varicose veins) in your legs: Wear support hose as told by your health care provider. Elevate your feet for 15 minutes, 3-4 times a day. Limit salt in your diet. Safety Wear your seat belt at all times when driving or riding in a car. Talk with your health care provider if someone is verbally or physically abusive to you. Talk with your health care provider if you are feeling sad or have thoughts of hurting yourself. Lifestyle Do  not use hot tubs, steam rooms, or saunas. Do not douche. Do not use tampons or scented sanitary pads. Do not use herbal remedies, alcohol, illegal drugs, or medicines that are not approved by your health care provider. Chemicals in these products can harm your baby. Do not use any products that contain nicotine or tobacco, such as cigarettes, e-cigarettes, and chewing tobacco. If you need help quitting, ask your health care provider. Avoid cat litter boxes and soil used by cats. These carry germs that can cause birth defects in the baby and possibly loss of the unborn baby (fetus) by miscarriage or stillbirth. General instructions During routine prenatal visits in the first trimester, your health care provider will do a physical exam, perform necessary tests, and ask you how things are going. Keep all follow-up visits. This is important. Ask for help if you have counseling or nutritional needs during pregnancy. Your health care provider can offer advice or refer you to specialists for help with various needs. Schedule a dentist appointment. At home, brush your teeth with a soft toothbrush. Floss gently. Write down your questions. Take them to your prenatal visits. Where to find more information American Pregnancy Association: americanpregnancy.org [employer]: https://www.todd-brady.net/ Office on [employer] Health: [employer] Contact a health care provider if you have: Dizziness. A fever. Mild pelvic cramps, pelvic pressure, or nagging pain in the abdominal area. Nausea, vomiting, or  diarrhea that lasts for 24 hours or longer. A bad-smelling vaginal discharge. Pain when you urinate. Known exposure to a contagious illness, such as chickenpox, measles, Zika virus, HIV, or hepatitis. Get help right away if you have: Spotting or bleeding from your vagina. Severe abdominal cramping or pain. Shortness of breath or chest pain. Any  kind of trauma, such as from a fall or a car crash. New or increased pain, swelling, or redness in an arm or leg. Summary The first trimester of pregnancy starts on the first day of your last menstrual period until the end of week 12 (months 1 through 3). Eating 4 or 5 small meals a day rather than 3 large meals may help to relieve nausea and vomiting. Do not use any products that contain nicotine or tobacco, such as cigarettes, e-cigarettes, and chewing tobacco. If you need help quitting, ask your health care provider. Keep all follow-up visits. This is important. This information is not intended to replace advice given to you by your health care provider. Make sure you discuss any questions you have with your health care provider. Document Revised: 02/02/2020 Document Reviewed: 12/09/2019 Elsevier Patient Education  2022 Elsevier Inc.  Hyperemesis Gravidarum Hyperemesis gravidarum is a severe form of nausea and vomiting that happens during pregnancy. Hyperemesis is worse than morning sickness. It may cause you to have nausea or vomiting all day for many days. It may keep you from eating and drinking enough food and liquids, which can lead to dehydration, malnutrition, and weight loss. Hyperemesis usually occurs during the first half (the first 20 weeks) of pregnancy. It often goes away once a woman is in her second half of pregnancy. However, sometimes hyperemesis continues through an entire pregnancy. What are the causes? The cause of this condition is not known. It may be associated with: Changes in hormones in the body during pregnancy. Changes in the gastrointestinal system. Genetic or inherited conditions. What are the signs or symptoms? Symptoms of this condition include: Severe nausea and vomiting that does not go away. Problems keeping food down. Weight loss. Loss of body fluid (dehydration). Loss of appetite. You may have no desire to eat or you may not like the food you have  previously enjoyed. How is this diagnosed? This condition may be diagnosed based on your medical history, your symptoms, and a physical exam. You may also have other tests, including: Blood tests. Urine tests. Blood pressure tests. Ultrasound to look for problems with the placenta or to check if you are pregnant with more than one baby. How is this treated? This condition is managed by controlling symptoms. This may include: Following an eating plan. This can help to lessen nausea and vomiting. Treatments that do not use medicine. These include acupressure bracelets, hypnosis, and eating or drinking foods or fluids that contain ginger, ginger ale, or ginger tea. Taking prescription medicine or over-the-counter medicine as told by your health care provider. Continuing to take prenatal vitamins. You may need to change what kind you take and when you take them. Follow your health care provider's instructions about prenatal vitamins. An eating plan and medicines are often used together to help control symptoms. If medicines do not help relieve nausea and vomiting, you may need to receive fluids through an IV at the hospital. Follow these instructions at home: To help relieve your symptoms, listen to your body. Everyone is different and has different preferences. Find what works best for you. Here are some things you can try to help  relieve your symptoms: Meals and snacks  Eat 5-6 small meals daily instead of 3 large meals. Eating small meals and snacks can help you avoid an empty stomach. Before getting out of bed, eat a couple of crackers to avoid moving around on an empty stomach. Eat a protein-rich snack before bed. Examples include cheese and crackers, or a peanut butter sandwich made with 1 slice of whole-wheat bread and 1 tsp (5 g) of peanut butter. Eat and drink slowly. Try eating starchy foods as these are usually tolerated well. Examples include cereal, toast, bread, potatoes, pasta,  rice, and pretzels. Eat at least one serving of protein with your meals and snacks. Protein options include lean meats, poultry, seafood, beans, nuts, nut butters, eggs, cheese, and yogurt. Eat or suck on things that have ginger in them. It may help to relieve nausea. Add  tsp (0.44 g) ground ginger to hot tea, or choose ginger tea. Fluids It is important to stay hydrated. Try to: Drink small amounts of fluids often. Drink fluids 30 minutes before or after a meal to help lessen the feeling of a full stomach. Drink 100% fruit juice or an electrolyte drink. An electrolyte drink contains sodium, potassium, and chloride. Drink fluids that are cold, clear, and carbonated or sour. These include lemonade, ginger ale, lemon-lime soda, ice water, and sparkling water. Things to avoid Avoid the following: Eating foods that trigger your symptoms. These may include spicy foods, coffee, high-fat foods, very sweet foods, and acidic foods. Drinking more than 1 cup of fluid at a time. Skipping meals. Nausea can be more intense on an empty stomach. If you cannot tolerate food, do not force it. Try sucking on ice chips or other frozen items and make up for missed calories later. Lying down within 2 hours after eating. Being exposed to environmental triggers. These may include food smells, smoky rooms, closed spaces, rooms with strong smells, warm or humid places, overly loud and noisy rooms, and rooms with motion or flickering lights. Try eating meals in a well-ventilated area that is free of strong smells. Making quick and sudden changes in your movement. Taking iron pills and multivitamins that contain iron. If you take prescription iron pills, do not stop taking them unless your health care provider approves. Preparing food. The smell of food can spoil your appetite or trigger nausea. General instructions Brush your teeth or use a mouth rinse after meals. Take over-the-counter and prescription medicines only  as told by your health care provider. Follow instructions from your health care provider about eating or drinking restrictions. Talk with your health care provider about starting a supplement of vitamin B6. Continue to take your prenatal vitamins as told by your health care provider. If you are having trouble taking your prenatal vitamins, talk with your health care provider about other options. Keep all follow-up visits. This is important. Follow-up visits include prenatal visits. Contact a health care provider if: You have pain in your abdomen. You have a severe headache. You have vision problems. You are losing weight. You feel weak or dizzy. You cannot eat or drink without vomiting, especially if this goes on for a full day. Get help right away if: You cannot drink fluids without vomiting. You vomit blood. You have constant nausea and vomiting. You are very weak. You faint. You have a fever and your symptoms suddenly get worse. Summary Hyperemesis gravidarum is a severe form of nausea and vomiting that happens during pregnancy. Making some changes to your eating  habits may help relieve nausea and vomiting. This condition may be managed with lifestyle changes and medicines as prescribed by your health care provider. If medicines do not help relieve nausea and vomiting, you may need to receive fluids through an IV at the hospital. This information is not intended to replace advice given to you by your health care provider. Make sure you discuss any questions you have with your health care provider. Document Revised: 03/20/2020 Document Reviewed: 03/20/2020 Elsevier Patient Education  2022 Elsevier Inc. Initial steps to help :   B6 (pyridoxine) 25 mg,  3-4 times a day- 200 mg a day total Unisom (doxylamine) 25 mg at bedtime **B6 and Unisom are available as a combination prescription medications called diclegis and bonjesta  B1 (thiamin)  50-100 mg 1-2 a day-  100 mg a day  total  Continue prenatal vitamin with iron and thiamin. If it is not tolerated switch to 1 mg of folic acid.  Can add medication for gastric reflux if needed.  Subsequent steps to be added to B1, B6, and Unisom:  Antihistamine (one of the following medications) Dramamine      25-50 mg every 4-6 hours Benadryl      25-50 mg every 4-6 hours Meclizine      25 mg every 6 hours  2. Dopamine Antagonist (one of the following medications) Metoclopramide  (Reglan)  5-10 mg every 6-8 hours         PO Promethazine   (Phenergan)   12.5-25 mg every 4-6 hours      PO or rectal Prochlorperazine  (Compazine)  5-10 mg every 6-8 hours     25mg  BID rectally   Subsequent steps if there has still not been improvement in symptoms:  3. Daily stool softner:  Colace 100 mg twice a day  4. Ondansetron  (Zofran)   4-8 mg every 6-8 hours  Gastroesophageal Reflux Disease, Adult Gastroesophageal reflux (GER) happens when acid from the stomach flows up into the tube that connects the mouth and the stomach (esophagus). Normally, food travels down the esophagus and stays in the stomach to be digested. However, when a person has GER, food and stomach acid sometimes move back up into the esophagus. If this becomes a more serious problem, the person may be diagnosed with a disease called gastroesophageal reflux disease (GERD). GERD occurs when the reflux: Happens often. Causes frequent or severe symptoms. Causes problems such as damage to the esophagus. When stomach acid comes in contact with the esophagus, the acid may cause inflammation in the esophagus. Over time, GERD may create small holes (ulcers) in the lining of the esophagus. What are the causes? This condition is caused by a problem with the muscle between the esophagus and the stomach (lower esophageal sphincter, or LES). Normally, the LES muscle closes after food passes through the esophagus to the stomach. When the LES is weakened or abnormal, it does not close  properly, and that allows food and stomach acid to go back up into the esophagus. The LES can be weakened by certain dietary substances, medicines, and medical conditions, including: Tobacco use. Pregnancy. Having a hiatal hernia. Alcohol use. Certain foods and beverages, such as coffee, chocolate, onions, and peppermint. What increases the risk? You are more likely to develop this condition if you: Have an increased body weight. Have a connective tissue disorder. Take NSAIDs, such as ibuprofen. What are the signs or symptoms? Symptoms of this condition include: Heartburn. Difficult or painful swallowing and the feeling  of having a lump in the throat. A bitter taste in the mouth. Bad breath and having a large amount of saliva. Having an upset or bloated stomach and belching. Chest pain. Different conditions can cause chest pain. Make sure you see your health care provider if you experience chest pain. Shortness of breath or wheezing. Ongoing (chronic) cough or a nighttime cough. Wearing away of tooth enamel. Weight loss. How is this diagnosed? This condition may be diagnosed based on a medical history and a physical exam. To determine if you have mild or severe GERD, your health care provider may also monitor how you respond to treatment. You may also have tests, including: A test to examine your stomach and esophagus with a small camera (endoscopy). A test that measures the acidity level in your esophagus. A test that measures how much pressure is on your esophagus. A barium swallow or modified barium swallow test to show the shape, size, and functioning of your esophagus. How is this treated? Treatment for this condition may vary depending on how severe your symptoms are. Your health care provider may recommend: Changes to your diet. Medicine. Surgery. The goal of treatment is to help relieve your symptoms and to prevent complications. Follow these instructions at home: Eating  and drinking  Follow a diet as recommended by your health care provider. This may involve avoiding foods and drinks such as: Coffee and tea, with or without caffeine. Drinks that contain alcohol. Energy drinks and sports drinks. Carbonated drinks or sodas. Chocolate and cocoa. Peppermint and mint flavorings. Garlic and onions. Horseradish. Spicy and acidic foods, including peppers, chili powder, curry powder, vinegar, hot sauces, and barbecue sauce. Citrus fruit juices and citrus fruits, such as oranges, lemons, and limes. Tomato-based foods, such as red sauce, chili, salsa, and pizza with red sauce. Fried and fatty foods, such as donuts, french fries, potato chips, and high-fat dressings. High-fat meats, such as hot dogs and fatty cuts of red and white meats, such as rib eye steak, sausage, ham, and bacon. High-fat dairy items, such as whole milk, butter, and cream cheese. Eat small, frequent meals instead of large meals. Avoid drinking large amounts of liquid with your meals. Avoid eating meals during the 2-3 hours before bedtime. Avoid lying down right after you eat. Do not exercise right after you eat. Lifestyle  Do not use any products that contain nicotine or tobacco. These products include cigarettes, chewing tobacco, and vaping devices, such as e-cigarettes. If you need help quitting, ask your health care provider. Try to reduce your stress by using methods such as yoga or meditation. If you need help reducing stress, ask your health care provider. If you are overweight, reduce your weight to an amount that is healthy for you. Ask your health care provider for guidance about a safe weight loss goal. General instructions Pay attention to any changes in your symptoms. Take over-the-counter and prescription medicines only as told by your health care provider. Do not take aspirin, ibuprofen, or other NSAIDs unless your health care provider told you to take these medicines. Wear  loose-fitting clothing. Do not wear anything tight around your waist that causes pressure on your abdomen. Raise (elevate) the head of your bed about 6 inches (15 cm). You can use a wedge to do this. Avoid bending over if this makes your symptoms worse. Keep all follow-up visits. This is important. Contact a health care provider if: You have: New symptoms. Unexplained weight loss. Difficulty swallowing or it hurts to  swallow. Wheezing or a persistent cough. A hoarse voice. Your symptoms do not improve with treatment. Get help right away if: You have sudden pain in your arms, neck, jaw, teeth, or back. You suddenly feel sweaty, dizzy, or light-headed. You have chest pain or shortness of breath. You vomit and the vomit is green, yellow, or black, or it looks like blood or coffee grounds. You faint. You have stool that is red, bloody, or black. You cannot swallow, drink, or eat. These symptoms may represent a serious problem that is an emergency. Do not wait to see if the symptoms will go away. Get medical help right away. Call your local emergency services (911 in the [place] Do not drive yourself to the hospital. Summary Gastroesophageal reflux happens when acid from the stomach flows up into the esophagus. GERD is a disease in which the reflux happens often, causes frequent or severe symptoms, or causes problems such as damage to the esophagus. Treatment for this condition may vary depending on how severe your symptoms are. Your health care provider may recommend diet and lifestyle changes, medicine, or surgery. Contact a health care provider if you have new or worsening symptoms. Take over-the-counter and prescription medicines only as told by your health care provider. Do not take aspirin, ibuprofen, or other NSAIDs unless your health care provider told you to do so. Keep all follow-up visits as told by your health care provider. This is important. This information is not intended to  replace advice given to you by your health care provider. Make sure you discuss any questions you have with your health care provider. Document Revised: 03/06/2020 Document Reviewed: 03/06/2020 Elsevier Patient Education  2022 [employer]. Food Choices for Gastroesophageal Reflux Disease, Adult When you have gastroesophageal reflux disease (GERD), the foods you eat and your eating habits are very important. Choosing the right foods can help ease the discomfort of GERD. Consider working with a dietitian to help you make healthy food choices. What are tips for following this plan? Reading food labels Look for foods that are low in saturated fat. Foods that have less than 5% of daily value (DV) of fat and 0 g of trans fats may help with your symptoms. Cooking Cook foods using methods other than frying. This may include baking, steaming, grilling, or broiling. These are all methods that do not need a lot of fat for cooking. To add flavor, try to use herbs that are low in spice and acidity. Meal planning  Choose healthy foods that are low in fat, such as fruits, vegetables, whole grains, low-fat dairy products, lean meats, fish, and poultry. Eat frequent, small meals instead of three large meals each day. Eat your meals slowly, in a relaxed setting. Avoid bending over or lying down until 2-3 hours after eating. Limit high-fat foods such as fatty meats or fried foods. Limit your intake of fatty foods, such as oils, butter, and shortening. Avoid the following as told by your health care provider: Foods that cause symptoms. These may be different for different people. Keep a food diary to keep track of foods that cause symptoms. Alcohol. Drinking large amounts of liquid with meals. Eating meals during the 2-3 hours before bed. Lifestyle Maintain a healthy weight. Ask your health care provider what weight is healthy for you. If you need to lose weight, work with your health care provider to do so  safely. Exercise for at least 30 minutes on 5 or more days each week, or as told by  your health care provider. Avoid wearing clothes that fit tightly around your waist and chest. Do not use any products that contain nicotine or tobacco. These products include cigarettes, chewing tobacco, and vaping devices, such as e-cigarettes. If you need help quitting, ask your health care provider. Sleep with the head of your bed raised. Use a wedge under the mattress or blocks under the bed frame to raise the head of the bed. Chew sugar-free gum after mealtimes. What foods should I eat? Eat a healthy, well-balanced diet of fruits, vegetables, whole grains, low-fat dairy products, lean meats, fish, and poultry. Each person is different. Foods that may trigger symptoms in one person may not trigger any symptoms in another person. Work with your health care provider to identify foods that are safe for you. The items listed above may not be a complete list of recommended foods and beverages. Contact a dietitian for more information. What foods should I avoid? Limiting some of these foods may help manage the symptoms of GERD. Everyone is different. Consult a dietitian or your health care provider to help you identify the exact foods to avoid, if any. Fruits Any fruits prepared with added fat. Any fruits that cause symptoms. For some people this may include citrus fruits, such as oranges, grapefruit, pineapple, and lemons. Vegetables Deep-fried vegetables. [place] fries. Any vegetables prepared with added fat. Any vegetables that cause symptoms. For some people, this may include tomatoes and tomato products, chili peppers, onions and garlic, and horseradish. Grains Pastries or quick breads with added fat. Meats and other proteins High-fat meats, such as fatty beef or pork, hot dogs, ribs, ham, sausage, salami, and bacon. Fried meat or protein, including fried fish and fried chicken. Nuts and nut butters, in large  amounts. Dairy Whole milk and chocolate milk. Sour cream. Cream. Ice cream. Cream cheese. Milkshakes. Fats and oils Butter. Margarine. Shortening. Ghee. Beverages Coffee and tea, with or without caffeine. Carbonated beverages. Sodas. Energy drinks. Fruit juice made with acidic fruits, such as orange or grapefruit. Tomato juice. Alcoholic drinks. Sweets and desserts Chocolate and cocoa. Donuts. Seasonings and condiments Pepper. Peppermint and spearmint. Added salt. Any condiments, herbs, or seasonings that cause symptoms. For some people, this may include curry, hot sauce, or vinegar-based salad dressings. The items listed above may not be a complete list of foods and beverages to avoid. Contact a dietitian for more information. Questions to ask your health care provider Diet and lifestyle changes are usually the first steps that are taken to manage symptoms of GERD. If diet and lifestyle changes do not improve your symptoms, talk with your health care provider about taking medicines. Where to find more information [employer]: aboutgerd.org Summary When you have gastroesophageal reflux disease (GERD), food and lifestyle choices may be very helpful in easing the discomfort of GERD. Eat frequent, small meals instead of three large meals each day. Eat your meals slowly, in a relaxed setting. Avoid bending over or lying down until 2-3 hours after eating. Limit high-fat foods such as fatty meats or fried foods. This information is not intended to replace advice given to you by your health care provider. Make sure you discuss any questions you have with your health care provider. Document Revised: 03/06/2020 Document Reviewed: 03/06/2020 Elsevier Patient Education  2022 [employer].

## 2021-06-05 LAB — MONITOR DRUG PROFILE 10(MW)
Amphetamine Scrn, Ur: NEGATIVE ng/mL
BARBITURATE SCREEN URINE: NEGATIVE ng/mL
BENZODIAZEPINE SCREEN, URINE: NEGATIVE ng/mL
CANNABINOIDS UR QL SCN: NEGATIVE ng/mL
Cocaine (Metab) Scrn, Ur: NEGATIVE ng/mL
Creatinine(Crt), U: 21.2 mg/dL (ref 20.0–300.0)
Methadone Screen, Urine: NEGATIVE ng/mL
OXYCODONE+OXYMORPHONE UR QL SCN: NEGATIVE ng/mL
Opiate Scrn, Ur: NEGATIVE ng/mL
Ph of Urine: 6.7 (ref 4.5–8.9)
Phencyclidine Qn, Ur: NEGATIVE ng/mL
Propoxyphene Scrn, Ur: NEGATIVE ng/mL

## 2021-06-06 LAB — CYTOLOGY - PAP
Chlamydia: NEGATIVE
Comment: NEGATIVE
Comment: NEGATIVE
Comment: NORMAL
Diagnosis: NEGATIVE
Neisseria Gonorrhea: NEGATIVE
Trichomonas: NEGATIVE

## 2021-06-06 LAB — NUSWAB BV AND CANDIDA, NAA
Candida albicans, NAA: NEGATIVE
Candida glabrata, NAA: NEGATIVE

## 2021-06-09 ENCOUNTER — Other Ambulatory Visit: Payer: Self-pay | Admitting: Obstetrics and Gynecology

## 2021-06-09 DIAGNOSIS — N3000 Acute cystitis without hematuria: Secondary | ICD-10-CM

## 2021-06-09 LAB — URINE CULTURE

## 2021-06-09 MED ORDER — NITROFURANTOIN MONOHYD MACRO 100 MG PO CAPS: 100.0000 mg | ORAL_CAPSULE | Freq: Two times a day (BID) | ORAL | 0 refills | Status: DC

## 2021-06-11 LAB — MATERNIT21 PLUS CORE+SCA

## 2021-06-12 ENCOUNTER — Other Ambulatory Visit: Payer: Self-pay

## 2021-06-12 ENCOUNTER — Telehealth: Payer: Self-pay

## 2021-06-12 ENCOUNTER — Other Ambulatory Visit: Payer: Self-pay | Admitting: Obstetrics and Gynecology

## 2021-06-12 ENCOUNTER — Other Ambulatory Visit: Payer: Self-pay | Admitting: Advanced Practice Midwife

## 2021-06-12 ENCOUNTER — Other Ambulatory Visit: Payer: Commercial Managed Care - PPO

## 2021-06-12 DIAGNOSIS — Z3400 Encounter for supervision of normal first pregnancy, unspecified trimester: Secondary | ICD-10-CM

## 2021-06-12 DIAGNOSIS — Z369 Encounter for antenatal screening, unspecified: Secondary | ICD-10-CM

## 2021-06-12 MED ORDER — VITAFOL GUMMIES 3.33-0.333-34.8 MG PO CHEW: 3.0000 | CHEWABLE_TABLET | Freq: Every day | ORAL | 11 refills | Status: DC

## 2021-06-12 NOTE — Progress Notes (Signed)
Prenatal vitamins rx sent

## 2021-06-12 NOTE — Telephone Encounter (Signed)
Materniti21 test cancelled due to not enough fetal DNA. Pt would like redrawn before next OB visit, can you put order in since she's seeing you at her next visit?

## 2021-06-12 NOTE — Progress Notes (Signed)
Order placed for redraw of MaterniT 21. First test canceled due to insufficient fetal DNA.

## 2021-06-12 NOTE — Telephone Encounter (Signed)
Pt aware.

## 2021-06-14 LAB — RPR+RH+ABO+RUB AB+AB SCR+CB...
Antibody Screen: NEGATIVE
HIV Screen 4th Generation wRfx: NONREACTIVE
Hematocrit: 36 % (ref 34.0–46.6)
Hemoglobin: 11.8 g/dL (ref 11.1–15.9)
Hepatitis B Surface Ag: NEGATIVE
MCH: 27 pg (ref 26.6–33.0)
MCHC: 32.8 g/dL (ref 31.5–35.7)
MCV: 82 fL (ref 79–97)
Platelets: 389 10*3/uL (ref 150–450)
RBC: 4.37 x10E6/uL (ref 3.77–5.28)
RDW: 15 % (ref 11.7–15.4)
RPR Ser Ql: NONREACTIVE
Rh Factor: POSITIVE
Rubella Antibodies, IGG: 7.25 index (ref >0.99)
Varicella zoster IgG: 944 index (ref >165)
WBC: 7 10*3/uL (ref 3.4–10.8)

## 2021-06-14 LAB — INHERITEST CORE(CF97,SMA,FRAX)

## 2021-06-14 LAB — HEPATITIS C ANTIBODY: Hep C Virus Ab: <0.1 s/co ratio (ref 0.0–0.9)

## 2021-06-14 LAB — HEMOGLOBIN A1C
Est. average glucose Bld gHb Est-mCnc: 117 mg/dL
Hgb A1c MFr Bld: 5.7 % — ABNORMAL HIGH (ref 4.8–5.6)

## 2021-06-17 LAB — MATERNIT21 PLUS CORE+SCA
Fetal Fraction: 9
Monosomy X (Turner Syndrome): NOT DETECTED
Result (T21): NEGATIVE
Trisomy 13 (Patau syndrome): NEGATIVE
Trisomy 18 (Edwards syndrome): NEGATIVE
Trisomy 21 (Down syndrome): NEGATIVE
XXX (Triple X Syndrome): NOT DETECTED
XXY (Klinefelter Syndrome): NOT DETECTED
XYY (Jacobs Syndrome): NOT DETECTED

## 2021-06-19 ENCOUNTER — Ambulatory Visit (INDEPENDENT_AMBULATORY_CARE_PROVIDER_SITE_OTHER): Payer: Commercial Managed Care - PPO | Admitting: Advanced Practice Midwife

## 2021-06-19 ENCOUNTER — Other Ambulatory Visit: Payer: Self-pay

## 2021-06-19 ENCOUNTER — Encounter: Payer: Self-pay | Admitting: Advanced Practice Midwife

## 2021-06-19 VITALS — BP 112/66 | Wt 139.0 lb

## 2021-06-19 DIAGNOSIS — Z3A13 13 weeks gestation of pregnancy: Secondary | ICD-10-CM

## 2021-06-19 DIAGNOSIS — Z3402 Encounter for supervision of normal first pregnancy, second trimester: Secondary | ICD-10-CM

## 2021-06-19 NOTE — Progress Notes (Signed)
  Routine Prenatal Care Visit  Subjective  Kristina [name] is a 25 y.o. G1P0 at [redacted]w[redacted]d being seen today for ongoing prenatal care.  She is currently monitored for the following issues for this low-risk pregnancy and has Supervision of normal first pregnancy, antepartum on their problem list.  ----------------------------------------------------------------------------------- Patient reports ongoing nausea with vomiting usually only first thing in the morning. She is taking zofran with good results.    . Vag. Bleeding: None.   . Leaking Fluid denies.  ----------------------------------------------------------------------------------- The following portions of the patient's history were reviewed and updated as appropriate: allergies, current medications, past family history, past medical history, past social history, past surgical history and problem list. Problem list updated.  Objective  Blood pressure 112/66, weight 139 lb (63 kg). Pregravid weight 142 lb (64.4 kg) Total Weight Gain -3 lb (-1.361 kg) Urinalysis: Urine Protein    Urine Glucose    Fetal Status: Fetal Heart Rate (bpm): 159         General:  Alert, oriented and cooperative. Patient is in no acute distress.  Skin: Skin is warm and dry. No rash noted.   Cardiovascular: Normal heart rate noted  Respiratory: Normal respiratory effort, no problems with respiration noted  Abdomen: Soft, gravid, appropriate for gestational age.       Pelvic:  Cervical exam deferred        Extremities: Normal range of motion.  Edema: None  Mental Status: Normal mood and affect. Normal behavior. Normal judgment and thought content.   Assessment   25 y.o. G1P0 at [redacted]w[redacted]d by  12/25/2021, by Ultrasound presenting for routine prenatal visit  Plan   pregnancy 1 Problems (from 06/04/21 to present)    Problem Noted Resolved   Supervision of normal first pregnancy, antepartum 06/04/2021 by Natale Milch, MD No   Overview Signed 06/04/2021   3:03 PM by Natale Milch, MD     Nursing Staff Provider  Office Location  [facility] Dating  10 wk [place]  Language  English Anatomy US    Flu Vaccine   Genetic Screen  NIPS:   TDaP vaccine    Hgb A1C or  GTT Early : Third trimester :   Covid vaccinated   LAB RESULTS   Rhogam   Blood Type     Feeding Plan  Antibody    Contraception  Rubella    Circumcision  RPR     Pediatrician   HBsAg     Support Person  HIV    Prenatal Classes  Varicella     GBS  (For PCN allergy, check sensitivities)   BTL Consent     VBAC Consent  Pap  2022    Hgb Electro      CF      SMA                   Preterm labor symptoms and general obstetric precautions including but not limited to vaginal bleeding, contractions, leaking of fluid and fetal movement were reviewed in detail with the patient.   Return in about 4 weeks (around 07/17/2021) for rob.  Tresea Mall, CNM 06/19/2021 4:22 PM

## 2021-06-19 NOTE — Progress Notes (Signed)
ROB - no concerns. RM 5 

## 2021-07-05 ENCOUNTER — Ambulatory Visit: Payer: Commercial Managed Care - PPO | Admitting: Obstetrics and Gynecology

## 2021-07-13 ENCOUNTER — Ambulatory Visit (INDEPENDENT_AMBULATORY_CARE_PROVIDER_SITE_OTHER): Payer: Commercial Managed Care - PPO

## 2021-07-13 DIAGNOSIS — R399 Unspecified symptoms and signs involving the genitourinary system: Secondary | ICD-10-CM

## 2021-07-13 LAB — POCT URINALYSIS DIPSTICK OB
Bilirubin, UA: NEGATIVE
Glucose, UA: NEGATIVE
Leukocytes, UA: NEGATIVE
Nitrite, UA: NEGATIVE
Spec Grav, UA: 1.02 (ref 1.010–1.025)
Urobilinogen, UA: 0.2 E.U./dL
pH, UA: 6 (ref 5.0–8.0)

## 2021-07-18 ENCOUNTER — Encounter: Payer: Commercial Managed Care - PPO | Admitting: Obstetrics

## 2021-07-18 LAB — URINE CULTURE: Organism ID, Bacteria: NO GROWTH

## 2021-07-19 ENCOUNTER — Encounter: Payer: Commercial Managed Care - PPO | Admitting: Obstetrics

## 2021-07-19 ENCOUNTER — Ambulatory Visit (INDEPENDENT_AMBULATORY_CARE_PROVIDER_SITE_OTHER): Payer: Commercial Managed Care - PPO | Admitting: Obstetrics and Gynecology

## 2021-07-19 ENCOUNTER — Other Ambulatory Visit: Payer: Self-pay

## 2021-07-19 ENCOUNTER — Encounter: Payer: Self-pay | Admitting: Obstetrics and Gynecology

## 2021-07-19 VITALS — BP 112/70 | Ht 63.0 in | Wt 142.8 lb

## 2021-07-19 DIAGNOSIS — Z3A17 17 weeks gestation of pregnancy: Secondary | ICD-10-CM

## 2021-07-19 DIAGNOSIS — Z3400 Encounter for supervision of normal first pregnancy, unspecified trimester: Secondary | ICD-10-CM

## 2021-07-19 DIAGNOSIS — O99810 Abnormal glucose complicating pregnancy: Secondary | ICD-10-CM

## 2021-07-19 LAB — POCT URINALYSIS DIPSTICK OB
Glucose, UA: NEGATIVE
POC,PROTEIN,UA: NEGATIVE

## 2021-07-19 NOTE — Progress Notes (Signed)
    Routine Prenatal Care Visit  Subjective  Kristina [name] is a 25 y.o. G1P0 at [redacted]w[redacted]d being seen today for ongoing prenatal care.  She is currently monitored for the following issues for this low-risk pregnancy and has Supervision of normal first pregnancy, antepartum on their problem list.  ----------------------------------------------------------------------------------- Patient reports  nausea, stretching discomfort in there pelvis .   Contractions: Not present. Vag. Bleeding: None.  Movement: Absent. Denies leaking of fluid.  ----------------------------------------------------------------------------------- The following portions of the patient's history were reviewed and updated as appropriate: allergies, current medications, past family history, past medical history, past social history, past surgical history and problem list. Problem list updated.   Objective  Blood pressure 112/70, height 5\' 3"  (1.6 m), weight 142 lb 12.8 oz (64.8 kg). Pregravid weight 142 lb (64.4 kg) Total Weight Gain 12.8 oz (0.363 kg) Urinalysis:      Fetal Status: Fetal Heart Rate (bpm): 145   Movement: Absent     General:  Alert, oriented and cooperative. Patient is in no acute distress.  Skin: Skin is warm and dry. No rash noted.   Cardiovascular: Normal heart rate noted  Respiratory: Normal respiratory effort, no problems with respiration noted  Abdomen: Soft, gravid, appropriate for gestational age. Pain/Pressure: Absent     Pelvic:  Cervical exam deferred        Extremities: Normal range of motion.  Edema: None  Mental Status: Normal mood and affect. Normal behavior. Normal judgment and thought content.     Assessment   25 y.o. G1P0 at [redacted]w[redacted]d by  12/25/2021, by Ultrasound presenting for routine prenatal visit  Plan   pregnancy 1 Problems (from 06/04/21 to present)     Problem Noted Resolved   Supervision of normal first pregnancy, antepartum 06/04/2021 by 06/06/2021, MD  No   Overview Addendum 07/19/2021  8:36 AM by 13/06/2021, MD     Nursing Staff Provider  Office Location  [facility] Dating  10 wk Natale Milch  Language  English Anatomy US    Flu Vaccine  Patient had at work Genetic Screen  NIPS: normal xx  TDaP vaccine    Hgb A1C or  GTT Early : hgba1c 5.7 Third trimester :   Covid vaccinated   LAB RESULTS   Rhogam  Not needed Blood Type O/Positive/-- (09/26 1506)   Feeding Plan  Antibody Negative (09/26 1506)  Contraception  Rubella 7.25 (09/26 1506)  Circumcision  RPR Non Reactive (09/26 1506)   Pediatrician   HBsAg Negative (09/26 1506)   Support Person  FOB: HIV Non Reactive (09/26 1506)  Prenatal Classes Discussed Varicella  Immune    GBS  (For PCN allergy, check sensitivities)   BTL Consent     VBAC Consent  Pap  2022    Hgb Electro      CF negative     SMA negative                 Discussed prenatal classes,  Encouraged 1 GTT Will check ferritin level, discussed iron rich diet Discussed management of nausea, provided with information  Gestational age appropriate obstetric precautions including but not limited to vaginal bleeding, contractions, leaking of fluid and fetal movement were reviewed in detail with the patient.    Return in about 3 weeks (around 08/09/2021) for 1GTT ASAP, ROB in 3 weeks after anatomy 14/09/2020.  [place] MD [facility], [facility] 07/19/2021, 8:42 AM

## 2021-07-19 NOTE — Patient Instructions (Signed)
Second Trimester of Pregnancy The second trimester of pregnancy is from week 13 through week 27. This is months 4 through 6 of pregnancy. The second trimester is often a time when you feel your best. Your body has adjusted to being pregnant, and you begin to feel better physically. During the second trimester: Morning sickness has lessened or stopped completely. You may have more energy. You may have an increase in appetite. The second trimester is also a time when the unborn baby (fetus) is growing rapidly. At the end of the sixth month, the fetus may be up to 12 inches long and weigh about 1 pounds. You will likely begin to feel the baby move (quickening) between 16 and 20 weeks of pregnancy. Body changes during your second trimester Your body continues to go through many changes during your second trimester. The changes vary and generally return to normal after the baby is born. Physical changes Your weight will continue to increase. You will notice your lower abdomen bulging out. You may begin to get stretch marks on your hips, abdomen, and breasts. Your breasts will continue to grow and to become tender. Dark spots or blotches (chloasma or mask of pregnancy) may develop on your face. A dark line from your belly button to the pubic area (linea nigra) may appear. You may have changes in your hair. These can include thickening of your hair, rapid growth, and changes in texture. Some people also have hair loss during or after pregnancy, or hair that feels dry or thin. Health changes You may develop headaches. You may have heartburn. You may develop constipation. You may develop hemorrhoids or swollen, bulging veins (varicose veins). Your gums may bleed and may be sensitive to brushing and flossing. You may urinate more often because the fetus is pressing on your bladder. You may have back pain. This is caused by: Weight gain. Pregnancy hormones that are relaxing the joints in your  pelvis. A shift in weight and the muscles that support your balance. Follow these instructions at home: Medicines Follow your health care provider's instructions regarding medicine use. Specific medicines may be either safe or unsafe to take during pregnancy. Do not take any medicines unless approved by your health care provider. Take a prenatal vitamin that contains at least 600 micrograms (mcg) of folic acid. Eating and drinking Eat a healthy diet that includes fresh fruits and vegetables, whole grains, good sources of protein such as meat, eggs, or tofu, and low-fat dairy products. Avoid raw meat and unpasteurized juice, milk, and cheese. These carry germs that can harm you and your baby. You may need to take these actions to prevent or treat constipation: Drink enough fluid to keep your urine pale yellow. Eat foods that are high in fiber, such as beans, whole grains, and fresh fruits and vegetables. Limit foods that are high in fat and processed sugars, such as fried or sweet foods. Activity Exercise only as directed by your health care provider. Most people can continue their usual exercise routine during pregnancy. Try to exercise for 30 minutes at least 5 days a week. Stop exercising if you develop contractions in your uterus. Stop exercising if you develop pain or cramping in the lower abdomen or lower back. Avoid exercising if it is very hot or humid or if you are at a high altitude. Avoid heavy lifting. If you choose to, you may have sex unless your health care provider tells you not to. Relieving pain and discomfort Wear a supportive bra  to prevent discomfort from breast tenderness. Take warm sitz baths to soothe any pain or discomfort caused by hemorrhoids. Use hemorrhoid cream if your health care provider approves. Rest with your legs raised (elevated) if you have leg cramps or low back pain. If you develop varicose veins: Wear support hose as told by your health care  provider. Elevate your feet for 15 minutes, 3-4 times a day. Limit salt in your diet. Safety Wear your seat belt at all times when driving or riding in a car. Talk with your health care provider if someone is verbally or physically abusive to you. Lifestyle Do not use hot tubs, steam rooms, or saunas. Do not douche. Do not use tampons or scented sanitary pads. Avoid cat litter boxes and soil used by cats. These carry germs that can cause birth defects in the baby and possibly loss of the fetus by miscarriage or stillbirth. Do not use herbal remedies, alcohol, illegal drugs, or medicines that are not approved by your health care provider. Chemicals in these products can harm your baby. Do not use any products that contain nicotine or tobacco, such as cigarettes, e-cigarettes, and chewing tobacco. If you need help quitting, ask your health care provider. General instructions During a routine prenatal visit, your health care provider will do a physical exam and other tests. He or she will also discuss your overall health. Keep all follow-up visits. This is important. Ask your health care provider for a referral to a local prenatal education class. Ask for help if you have counseling or nutritional needs during pregnancy. Your health care provider can offer advice or refer you to specialists for help with various needs. Where to find more information American Pregnancy Association: americanpregnancy.org [employer]: https://www.todd-brady.net/ Office on [employer] Health: [employer] Contact a health care provider if you have: A headache that does not go away when you take medicine. Vision changes or you see spots in front of your eyes. Mild pelvic cramps, pelvic pressure, or nagging pain in the abdominal area. Persistent nausea, vomiting, or diarrhea. A bad-smelling vaginal discharge or foul-smelling urine. Pain when you  urinate. Sudden or extreme swelling of your face, hands, ankles, feet, or legs. A fever. Get help right away if you: Have fluid leaking from your vagina. Have spotting or bleeding from your vagina. Have severe abdominal cramping or pain. Have difficulty breathing. Have chest pain. Have fainting spells. Have not felt your baby move for the time period told by your health care provider. Have new or increased pain, swelling, or redness in an arm or leg. Summary The second trimester of pregnancy is from week 13 through week 27 (months 4 through 6). Do not use herbal remedies, alcohol, illegal drugs, or medicines that are not approved by your health care provider. Chemicals in these products can harm your baby. Exercise only as directed by your health care provider. Most people can continue their usual exercise routine during pregnancy. Keep all follow-up visits. This is important. This information is not intended to replace advice given to you by your health care provider. Make sure you discuss any questions you have with your health care provider. Document Revised: 02/02/2020 Document Reviewed: 12/09/2019 Elsevier Patient Education  2022 Elsevier Inc. Initial steps to help :   B6 (pyridoxine) 25 mg,  3-4 times a day- 200 mg a day total Unisom (doxylamine) 25 mg at bedtime **B6 and Unisom are available as a combination prescription medications called diclegis and bonjesta  B1 (thiamin)  50-100 mg 1-2 a day-  100 mg a day total  Continue prenatal vitamin with iron and thiamin. If it is not tolerated switch to 1 mg of folic acid.  Can add medication for gastric reflux if needed.  Subsequent steps to be added to B1, B6, and Unisom:  Antihistamine (one of the following medications) Dramamine      25-50 mg every 4-6 hours Benadryl      25-50 mg every 4-6 hours Meclizine      25 mg every 6 hours  2. Dopamine Antagonist (one of the following medications) Metoclopramide  (Reglan)  5-10 mg  every 6-8 hours         PO Promethazine   (Phenergan)   12.5-25 mg every 4-6 hours      PO or rectal Prochlorperazine  (Compazine)  5-10 mg every 6-8 hours     25mg  BID rectally   Subsequent steps if there has still not been improvement in symptoms:  3. Daily stool softner:  Colace 100 mg twice a day  4. Ondansetron  (Zofran)   4-8 mg every 6-8 hours  Hyperemesis Gravidarum Hyperemesis gravidarum is a severe form of nausea and vomiting that happens during pregnancy. Hyperemesis is worse than morning sickness. It may cause you to have nausea or vomiting all day for many days. It may keep you from eating and drinking enough food and liquids, which can lead to dehydration, malnutrition, and weight loss. Hyperemesis usually occurs during the first half (the first 20 weeks) of pregnancy. It often goes away once a woman is in her second half of pregnancy. However, sometimes hyperemesis continues through an entire pregnancy. What are the causes? The cause of this condition is not known. It may be associated with: Changes in hormones in the body during pregnancy. Changes in the gastrointestinal system. Genetic or inherited conditions. What are the signs or symptoms? Symptoms of this condition include: Severe nausea and vomiting that does not go away. Problems keeping food down. Weight loss. Loss of body fluid (dehydration). Loss of appetite. You may have no desire to eat or you may not like the food you have previously enjoyed. How is this diagnosed? This condition may be diagnosed based on your medical history, your symptoms, and a physical exam. You may also have other tests, including: Blood tests. Urine tests. Blood pressure tests. Ultrasound to look for problems with the placenta or to check if you are pregnant with more than one baby. How is this treated? This condition is managed by controlling symptoms. This may include: Following an eating plan. This can help to lessen nausea and  vomiting. Treatments that do not use medicine. These include acupressure bracelets, hypnosis, and eating or drinking foods or fluids that contain ginger, ginger ale, or ginger tea. Taking prescription medicine or over-the-counter medicine as told by your health care provider. Continuing to take prenatal vitamins. You may need to change what kind you take and when you take them. Follow your health care provider's instructions about prenatal vitamins. An eating plan and medicines are often used together to help control symptoms. If medicines do not help relieve nausea and vomiting, you may need to receive fluids through an IV at the hospital. Follow these instructions at home: To help relieve your symptoms, listen to your body. Everyone is different and has different preferences. Find what works best for you. Here are some things you can try to help relieve your symptoms: Meals and snacks  Eat 5-6 small meals daily  instead of 3 large meals. Eating small meals and snacks can help you avoid an empty stomach. Before getting out of bed, eat a couple of crackers to avoid moving around on an empty stomach. Eat a protein-rich snack before bed. Examples include cheese and crackers, or a peanut butter sandwich made with 1 slice of whole-wheat bread and 1 tsp (5 g) of peanut butter. Eat and drink slowly. Try eating starchy foods as these are usually tolerated well. Examples include cereal, toast, bread, potatoes, pasta, rice, and pretzels. Eat at least one serving of protein with your meals and snacks. Protein options include lean meats, poultry, seafood, beans, nuts, nut butters, eggs, cheese, and yogurt. Eat or suck on things that have ginger in them. It may help to relieve nausea. Add  tsp (0.44 g) ground ginger to hot tea, or choose ginger tea. Fluids It is important to stay hydrated. Try to: Drink small amounts of fluids often. Drink fluids 30 minutes before or after a meal to help lessen the feeling of  a full stomach. Drink 100% fruit juice or an electrolyte drink. An electrolyte drink contains sodium, potassium, and chloride. Drink fluids that are cold, clear, and carbonated or sour. These include lemonade, ginger ale, lemon-lime soda, ice water, and sparkling water. Things to avoid Avoid the following: Eating foods that trigger your symptoms. These may include spicy foods, coffee, high-fat foods, very sweet foods, and acidic foods. Drinking more than 1 cup of fluid at a time. Skipping meals. Nausea can be more intense on an empty stomach. If you cannot tolerate food, do not force it. Try sucking on ice chips or other frozen items and make up for missed calories later. Lying down within 2 hours after eating. Being exposed to environmental triggers. These may include food smells, smoky rooms, closed spaces, rooms with strong smells, warm or humid places, overly loud and noisy rooms, and rooms with motion or flickering lights. Try eating meals in a well-ventilated area that is free of strong smells. Making quick and sudden changes in your movement. Taking iron pills and multivitamins that contain iron. If you take prescription iron pills, do not stop taking them unless your health care provider approves. Preparing food. The smell of food can spoil your appetite or trigger nausea. General instructions Brush your teeth or use a mouth rinse after meals. Take over-the-counter and prescription medicines only as told by your health care provider. Follow instructions from your health care provider about eating or drinking restrictions. Talk with your health care provider about starting a supplement of vitamin B6. Continue to take your prenatal vitamins as told by your health care provider. If you are having trouble taking your prenatal vitamins, talk with your health care provider about other options. Keep all follow-up visits. This is important. Follow-up visits include prenatal visits. Contact a  health care provider if: You have pain in your abdomen. You have a severe headache. You have vision problems. You are losing weight. You feel weak or dizzy. You cannot eat or drink without vomiting, especially if this goes on for a full day. Get help right away if: You cannot drink fluids without vomiting. You vomit blood. You have constant nausea and vomiting. You are very weak. You faint. You have a fever and your symptoms suddenly get worse. Summary Hyperemesis gravidarum is a severe form of nausea and vomiting that happens during pregnancy. Making some changes to your eating habits may help relieve nausea and vomiting. This condition may be managed  with lifestyle changes and medicines as prescribed by your health care provider. If medicines do not help relieve nausea and vomiting, you may need to receive fluids through an IV at the hospital. This information is not intended to replace advice given to you by your health care provider. Make sure you discuss any questions you have with your health care provider. Document Revised: 03/20/2020 Document Reviewed: 03/20/2020 Elsevier Patient Education  2022 Elsevier Inc. Iron-Rich Diet Iron is a mineral that helps your body produce hemoglobin. Hemoglobin is a protein in red blood cells that carries oxygen to your body's tissues. Eating too little iron may cause you to feel weak and tired, and it can increase your risk of infection. Iron is naturally found in many foods, and many foods have iron added to them (are iron-fortified). You may need to follow an iron-rich diet if you do not have enough iron in your body due to certain medical conditions. The amount of iron that you need each day depends on your age, your sex, and any medical conditions you have. Follow instructions from your health care provider or a dietitian about how much iron you should eat each day. What are tips for following this plan? Reading food labels Check food labels to  see how many milligrams (mg) of iron are in each serving. Cooking Cook foods in pots and pans that are made from iron. Take these steps to make it easier for your body to absorb iron from certain foods: Soak beans overnight before cooking. Soak whole grains overnight and drain them before using. Ferment flours before baking, such as by using yeast in bread dough. Meal planning When you eat foods that contain iron, you should eat them with foods that are high in vitamin C. These include oranges, peppers, tomatoes, potatoes, and mangoes. Vitamin C helps your body absorb iron. Certain foods and drinks prevent your body from absorbing iron properly. Avoid eating these foods in the same meal as iron-rich foods or with iron supplements. These foods include: Coffee, black tea, and red wine. Milk, dairy products, and foods that are high in calcium. Beans and soybeans. Whole grains. General information Take iron supplements only as told by your health care provider. An overdose of iron can be life-threatening. If you were prescribed iron supplements, take them with orange juice or a vitamin C supplement. When you eat iron-fortified foods or take an iron supplement, you should also eat foods that naturally contain iron, such as meat, poultry, and fish. Eating naturally iron-rich foods helps your body absorb the iron that is added to other foods or contained in a supplement. Iron from animal sources is better absorbed than iron from plant sources. What foods should I eat? Fruits Prunes. Raisins. Eat fruits high in vitamin C, such as oranges, grapefruits, and strawberries, with iron-rich foods. Vegetables Spinach (cooked). Green peas. Broccoli. Fermented vegetables. Eat vegetables high in vitamin C, such as leafy greens, potatoes, bell peppers, and tomatoes, with iron-rich foods. Grains Iron-fortified breakfast cereal. Iron-fortified whole-wheat bread. Enriched rice. Sprouted grains. Meats and other  proteins Beef liver. Beef. [place]. Chicken. Oysters. Shrimp. Tuna. Sardines. Chickpeas. Nuts. Tofu. Pumpkin seeds. Beverages Tomato juice. Fresh orange juice. Prune juice. Hibiscus tea. Iron-fortified instant breakfast shakes. Sweets and desserts Blackstrap molasses. Seasonings and condiments Tahini. Fermented soy sauce. Other foods Wheat germ. The items listed above may not be a complete list of recommended foods and beverages. Contact a dietitian for more information. What foods should I limit? These are foods  that should be limited while eating iron-rich foods as they can reduce the absorption of iron in your body. Grains Whole grains. Bran cereal. Bran flour. Meats and other proteins Soybeans. Products made from soy protein. Black beans. Lentils. Mung beans. Split peas. Dairy Milk. Cream. Cheese. Yogurt. Cottage cheese. Beverages Coffee. Black tea. Red wine. Sweets and desserts Cocoa. Chocolate. Ice cream. Seasonings and condiments Basil. Oregano. Large amounts of parsley. The items listed above may not be a complete list of foods and beverages you should limit. Contact a dietitian for more information. Summary Iron is a mineral that helps your body produce hemoglobin. Hemoglobin is a protein in red blood cells that carries oxygen to your body's tissues. Iron is naturally found in many foods, and many foods have iron added to them (are iron-fortified). When you eat foods that contain iron, you should eat them with foods that are high in vitamin C. Vitamin C helps your body absorb iron. Certain foods and drinks prevent your body from absorbing iron properly, such as whole grains and dairy products. You should avoid eating these foods in the same meal as iron-rich foods or with iron supplements. This information is not intended to replace advice given to you by your health care provider. Make sure you discuss any questions you have with your health care provider. Document Revised:  08/07/2020 Document Reviewed: 08/07/2020 Elsevier Patient Education  2022 [employer].

## 2021-07-20 ENCOUNTER — Other Ambulatory Visit: Payer: Commercial Managed Care - PPO

## 2021-07-20 DIAGNOSIS — O99810 Abnormal glucose complicating pregnancy: Secondary | ICD-10-CM

## 2021-07-20 DIAGNOSIS — Z3400 Encounter for supervision of normal first pregnancy, unspecified trimester: Secondary | ICD-10-CM

## 2021-07-21 LAB — GLUCOSE TOLERANCE, 1 HOUR: Glucose, 1Hr PP: 100 mg/dL (ref 70–199)

## 2021-07-21 LAB — FERRITIN: Ferritin: 17 ng/mL (ref 15–150)

## 2021-07-23 ENCOUNTER — Other Ambulatory Visit: Payer: Commercial Managed Care - PPO

## 2021-08-09 ENCOUNTER — Ambulatory Visit
Admission: RE | Admit: 2021-08-09 | Discharge: 2021-08-09 | Disposition: A | Discharge status: Home / Self Care | Payer: Commercial Managed Care - PPO | Source: Ambulatory Visit | Attending: Obstetrics and Gynecology | Admitting: Obstetrics and Gynecology

## 2021-08-09 ENCOUNTER — Other Ambulatory Visit: Payer: Self-pay

## 2021-08-09 DIAGNOSIS — Z3402 Encounter for supervision of normal first pregnancy, second trimester: Secondary | ICD-10-CM | POA: Insufficient documentation

## 2021-08-09 DIAGNOSIS — Z3400 Encounter for supervision of normal first pregnancy, unspecified trimester: Secondary | ICD-10-CM

## 2021-08-10 ENCOUNTER — Ambulatory Visit (INDEPENDENT_AMBULATORY_CARE_PROVIDER_SITE_OTHER): Payer: Commercial Managed Care - PPO | Admitting: Obstetrics

## 2021-08-10 VITALS — BP 100/64 | Wt 148.0 lb

## 2021-08-10 DIAGNOSIS — Z3A20 20 weeks gestation of pregnancy: Secondary | ICD-10-CM

## 2021-08-10 DIAGNOSIS — Z3400 Encounter for supervision of normal first pregnancy, unspecified trimester: Secondary | ICD-10-CM

## 2021-08-10 LAB — POCT URINALYSIS DIPSTICK OB
Glucose, UA: NEGATIVE
POC,PROTEIN,UA: NEGATIVE

## 2021-08-10 NOTE — Progress Notes (Signed)
ROB- Anatomy scan f/u, no concerns 

## 2021-08-10 NOTE — Progress Notes (Signed)
  Routine Prenatal Care Visit  Subjective  Kristina [name] is a 25 y.o. G1P0 at [redacted]w[redacted]d being seen today for ongoing prenatal care.  She is currently monitored for the following issues for this low-risk pregnancy and has Supervision of normal first pregnancy, antepartum on their problem list.  ----------------------------------------------------------------------------------- Patient reports no complaints.  She had an anatomy scan today.  .  .   Kristina [name] denies.  ----------------------------------------------------------------------------------- The following portions of the patient's history were reviewed and updated as appropriate: allergies, current medications, past family history, past medical history, past social history, past surgical history and problem list. Problem list updated.  Objective  Blood pressure 100/64, weight 148 lb (67.1 kg). Pregravid weight 142 lb (64.4 kg) Total Weight Gain 6 lb (2.722 kg) Urinalysis: Urine Protein Negative  Urine Glucose Negative  Fetal Status:           General:  Alert, oriented and cooperative. Patient is in no acute distress.  Skin: Skin is warm and dry. No rash noted.   Cardiovascular: Normal heart rate noted  Respiratory: Normal respiratory effort, no problems with respiration noted  Abdomen: Soft, gravid, appropriate for gestational age.       Pelvic:  Cervical exam deferred        Extremities: Normal range of motion.     Mental Status: Normal mood and affect. Normal behavior. Normal judgment and thought content.   Assessment   25 y.o. G1P0 at [redacted]w[redacted]d by  12/25/2021, by Ultrasound presenting for routine prenatal visit  Plan   pregnancy 1 Problems (from 06/04/21 to present)    Problem Noted Resolved   Supervision of normal first pregnancy, antepartum 06/04/2021 by Natale Milch, MD No   Overview Addendum 08/10/2021  4:42 PM by Mirna Mires, CNM     Nursing Staff Provider  Office Location  [facility] Dating  10  wk [place]  Language  English Anatomy US  Normal female- gender surprise  Flu Vaccine  Patient had at work Genetic Screen  NIPS: normal xx  TDaP vaccine    Hgb A1C or  GTT Early : hgba1c 5.7 Third trimester :   Covid vaccinated   LAB RESULTS   Rhogam  Not needed Blood Type O/Positive/-- (09/26 1506)   Feeding Plan  Antibody Negative (09/26 1506)  Contraception  Rubella 7.25 (09/26 1506)  Circumcision  RPR Non Reactive (09/26 1506)   Pediatrician   HBsAg Negative (09/26 1506)   Support Person  FOB: HIV Non Reactive (09/26 1506)  Prenatal Classes Discussed Varicella  Immune    GBS  (For PCN allergy, check sensitivities)   BTL Consent     VBAC Consent  Pap  2022    Hgb Electro      CF negative     SMA negative                  Preterm labor symptoms and general obstetric precautions including but not limited to vaginal bleeding, contractions, leaking of [name] and fetal movement were reviewed in detail with the patient. Please refer to After Visit Summary for other counseling recommendations.   Return in about 4 weeks (around 09/07/2021) for return OB.  Mirna Mires, CNM  08/10/2021 4:42 PM

## 2021-09-05 ENCOUNTER — Ambulatory Visit (INDEPENDENT_AMBULATORY_CARE_PROVIDER_SITE_OTHER): Payer: Commercial Managed Care - PPO | Admitting: Licensed Practical Nurse

## 2021-09-05 ENCOUNTER — Other Ambulatory Visit: Payer: Self-pay

## 2021-09-05 VITALS — BP 106/66 | Wt 155.0 lb

## 2021-09-05 DIAGNOSIS — Z3400 Encounter for supervision of normal first pregnancy, unspecified trimester: Secondary | ICD-10-CM

## 2021-09-05 DIAGNOSIS — Z3A24 24 weeks gestation of pregnancy: Secondary | ICD-10-CM

## 2021-09-05 LAB — POCT URINALYSIS DIPSTICK OB
Glucose, UA: NEGATIVE
POC,PROTEIN,UA: NEGATIVE

## 2021-09-05 NOTE — Progress Notes (Signed)
ROB - no concerns. RM 4 °

## 2021-09-05 NOTE — Progress Notes (Signed)
°  Routine Prenatal Care Visit  Subjective  Kristina [name] is a 25 y.o. G1P0 at [redacted]w[redacted]d being seen today for ongoing prenatal care.  She is currently monitored for the following issues for this low-risk pregnancy and has Supervision of normal first pregnancy, antepartum on their problem list.  ----------------------------------------------------------------------------------- Patient reports no complaints.  Feels pretty good.  Desires to breastfeed, recommend BF class and CBE.   .  .   [name] denies.  ----------------------------------------------------------------------------------- The following portions of the patient's history were reviewed and updated as appropriate: allergies, current medications, past family history, past medical history, past social history, past surgical history and problem list. Problem list updated.  Objective  Blood pressure 106/66, weight 155 lb (70.3 kg). Pregravid weight 142 lb (64.4 kg) Total Weight Gain 13 lb (5.897 kg) Urinalysis: Urine Protein    Urine Glucose    Fetal Status: Fetal Heart Rate (bpm): 140 Fundal Height: 22 cm Movement: Present     General:  Alert, oriented and cooperative. Patient is in no acute distress.  Skin: Skin is warm and dry. No rash noted.   Cardiovascular: Normal heart rate noted  Respiratory: Normal respiratory effort, no problems with respiration noted  Abdomen: Soft, gravid, appropriate for gestational age.       Pelvic:  Cervical exam deferred        Extremities: Normal range of motion.     Mental Status: Normal mood and affect. Normal behavior. Normal judgment and thought content.   Assessment   25 y.o. G1P0 at [redacted]w[redacted]d by  12/25/2021, by Ultrasound presenting for routine prenatal visit  Plan   pregnancy 1 Problems (from 06/04/21 to present)     Problem Noted Resolved   Supervision of normal first pregnancy, antepartum 06/04/2021 by Natale Milch, MD No   Overview Addendum 09/05/2021  8:31 AM by  Ellwood Sayers, CNM     Nursing Staff Provider  Office Location  [facility] Dating  10 wk [place]  Language  English Anatomy US  Normal female- gender surprise  Flu Vaccine  Patient had at work Genetic Screen  NIPS: normal xx  TDaP vaccine    Hgb A1C or  GTT Early : hgba1c 5.7 Third trimester :   Covid vaccinated   LAB RESULTS   Rhogam  Not needed Blood Type O/Positive/-- (09/26 1506)   Feeding Plan breast Antibody Negative (09/26 1506)  Contraception POP Rubella 7.25 (09/26 1506)  Circumcision NA RPR Non Reactive (09/26 1506)   Pediatrician   HBsAg Negative (09/26 1506)   Support Person  FOB: HIV Non Reactive (09/26 1506)  Prenatal Classes Discussed Varicella  Immune    GBS  (For PCN allergy, check sensitivities)   BTL Consent     VBAC Consent  Pap  2022    Hgb Electro      CF negative     SMA negative                   Preterm labor symptoms and general obstetric precautions including but not limited to vaginal bleeding, contractions, leaking of fluid and fetal movement were reviewed in detail with the patient. Please refer to After Visit Summary for other counseling recommendations.   28 week labs next visit  Carie Caddy, CNM  [facility]  09/05/21  8:34 AM

## 2021-09-09 NOTE — L&D Delivery Note (Signed)
? ? ?     Delivery Note  ? ?Kristina [name] is a 26 y.o. G1P0 at [redacted]w[redacted]d Estimated Date of Delivery: 12/25/21 ? ?PRE-OPERATIVE DIAGNOSIS:  ?1) [redacted]w[redacted]d pregnancy.  ?2) Labor ?3) Suspected Chorioamnionitis ?4) Poor maternal expulsive effort ? ?POST-OPERATIVE DIAGNOSIS:  ?1) [redacted]w[redacted]d pregnancy s/p Vaginal, Vacuum (Extractor)  ?2) Viable female infant ? ?Delivery Type: Vaginal, Vacuum (Extractor)   ? ?Delivery Anesthesia: Epidural  ? ?Labor Complications:  slow progress to complete.  Prolonged second stage, Occ decelerations ?  ? ?ESTIMATED BLOOD LOSS: 750 ml   ? ?FINDINGS:   ?1) female infant, Apgar scores of    3at 1 minute and   6 at 5 minutes and a birthweight of   ounces.   ? ?2) Nuchal cord: Yes ? ?SPECIMENS:  ? PLACENTA: ?  Appearance: Intact  ?  Removal: Spontaneous    ?  Disposition:   ? ?DISPOSITION:  ?Infant to transported to Neonatal ICU ? ?NARRATIVE SUMMARY: ?Labor course:  Kristina [name] is a G1P0 at [redacted]w[redacted]d who presented for contractions and cervical dilation.  She progressed slowly in labor requiring pitocin.  Baby evidenced accelerations and good variability throughout most of her labor. Mom got an epidural.  The patient pushed during her second stage on and off for @ 4 hours.  Contractions were somewhat irregular and palpated mil-moderate.  Her effort was there, but she was not able to place expulsive effort correctly on the baby.  The epidural was slowly turned down as the pitocin was turned up.  During the second stage maternal fever was noted and abx were started.  The baby showed some decelerations, but had good recovery each time and good vaiablility as well as persistent response to fetal scalp stimulation.  Maternal exhaustion became a factor.  The patient specifically requested not to have a CD an multiple occasions.  With the head on the pelvic floor at +2 station, a vacuum delivery was deemed indicated.  Examination showed adequate room in the pelvis.  The patient remained  comfortable with epidural despite it being halved.  The vacuum was applied and over the next 25 minutes traction was applied with fetal decent with each pull and push.  The head was delivered in a controlled manner from ROA position.  A nuchal cord was present.  The cord was clamped and cut and infant handed to neonatal personnel.  ?The placenta delivered without problems and was noted to be complete. ?A perineal and vaginal examination was performed. ?Lacerations: 2nd degree with Right labial tear ?Lacerations were repaired with Vicryl suture using local anesthesia. ?The patient tolerated this well. ? ?Elonda Husky, M.D. ?12/28/2021 ?7:09 AM ? ?

## 2021-10-03 ENCOUNTER — Other Ambulatory Visit: Payer: Self-pay | Admitting: Obstetrics

## 2021-10-03 ENCOUNTER — Ambulatory Visit (INDEPENDENT_AMBULATORY_CARE_PROVIDER_SITE_OTHER): Payer: No Typology Code available for payment source | Admitting: Obstetrics

## 2021-10-03 ENCOUNTER — Other Ambulatory Visit: Payer: Self-pay

## 2021-10-03 ENCOUNTER — Other Ambulatory Visit: Payer: No Typology Code available for payment source

## 2021-10-03 VITALS — BP 118/70 | Wt 166.0 lb

## 2021-10-03 DIAGNOSIS — Z3A28 28 weeks gestation of pregnancy: Secondary | ICD-10-CM

## 2021-10-03 DIAGNOSIS — Z3400 Encounter for supervision of normal first pregnancy, unspecified trimester: Secondary | ICD-10-CM

## 2021-10-03 DIAGNOSIS — R7309 Other abnormal glucose: Secondary | ICD-10-CM

## 2021-10-03 NOTE — Addendum Note (Signed)
Addended by: Mirna Mires on: 10/03/2021 11:56 AM   Modules accepted: Orders

## 2021-10-03 NOTE — Progress Notes (Signed)
28 week labs today. No vb. No lof.  

## 2021-10-03 NOTE — Progress Notes (Signed)
Routine Prenatal Care Visit  Subjective  Kristina [name] is a 26 y.o. G1P0 at [redacted]w[redacted]d being seen today for ongoing prenatal care.  She is currently monitored for the following issues for this low-risk pregnancy and has Supervision of normal first pregnancy, antepartum on their problem list.  ----------------------------------------------------------------------------------- Patient reports no complaints.   Contractions: Not present. Vag. Bleeding: None.  Movement: Present. Leaking Fluid denies.  ----------------------------------------------------------------------------------- The following portions of the patient's history were reviewed and updated as appropriate: allergies, current medications, past family history, past medical history, past social history, past surgical history and problem list. Problem list updated.  Objective  Blood pressure 118/70, weight 166 lb (75.3 kg). Pregravid weight 142 lb (64.4 kg) Total Weight Gain 24 lb (10.9 kg) Urinalysis: Urine Protein    Urine Glucose    Fetal Status:     Movement: Present     General:  Alert, oriented and cooperative. Patient is in no acute distress.  Skin: Skin is warm and dry. No rash noted.   Cardiovascular: Normal heart rate noted  Respiratory: Normal respiratory effort, no problems with respiration noted  Abdomen: Soft, gravid, appropriate for gestational age. Pain/Pressure: Absent     Pelvic:  Cervical exam deferred        Extremities: Normal range of motion.     Mental Status: Normal mood and affect. Normal behavior. Normal judgment and thought content.   Assessment   26 y.o. G1P0 at [redacted]w[redacted]d by  12/25/2021, by Ultrasound presenting for routine prenatal visit  Plan   pregnancy 1 Problems (from 06/04/21 to present)    Problem Noted Resolved   Supervision of normal first pregnancy, antepartum 06/04/2021 by Homero Fellers, MD No   Overview Addendum 09/05/2021  8:31 AM by Allen Derry, [place]  Staff Provider  Office Location  [facility] Dating  10 wk [place]  Language  English Anatomy US  Normal female- gender surprise  Flu Vaccine  Patient had at work Genetic Screen  NIPS: normal xx  TDaP vaccine    Hgb A1C or  GTT Early : hgba1c 5.7 Third trimester :   Covid vaccinated   LAB RESULTS   Rhogam  Not needed Blood Type O/Positive/-- (09/26 1506)   Feeding Plan breast Antibody Negative (09/26 1506)  Contraception POP Rubella 7.25 (09/26 1506)  Circumcision NA RPR Non Reactive (09/26 1506)   Pediatrician   HBsAg Negative (09/26 1506)   Support Person  FOB: HIV Non Reactive (09/26 1506)  Prenatal Classes Discussed Varicella  Immune    GBS  (For PCN allergy, check sensitivities)   BTL Consent     VBAC Consent  Pap  2022    Hgb Electro      CF negative     SMA negative                  Preterm labor symptoms and general obstetric precautions including but not limited to vaginal bleeding, contractions, leaking of fluid and fetal movement were reviewed in detail with the patient. Please refer to After Visit Summary for other counseling recommendations.  Routine 28 week labs today.  No follow-ups on file.  Imagene Riches, CNM  10/03/2021 10:33 AM

## 2021-10-04 LAB — 28 WEEKS RH-PANEL
Antibody Screen: NEGATIVE
Basophils Absolute: 0 10*3/uL (ref 0.0–0.2)
Basos: 0 %
EOS (ABSOLUTE): 0.1 10*3/uL (ref 0.0–0.4)
Eos: 1 %
Gestational Diabetes Screen: 82 mg/dL (ref 70–139)
HIV Screen 4th Generation wRfx: NONREACTIVE
Hematocrit: 35.4 % (ref 34.0–46.6)
Hemoglobin: 11.8 g/dL (ref 11.1–15.9)
Immature Grans (Abs): 0.2 10*3/uL — ABNORMAL HIGH (ref 0.0–0.1)
Immature Granulocytes: 2 %
Lymphocytes Absolute: 1.4 10*3/uL (ref 0.7–3.1)
Lymphs: 14 %
MCH: 31.2 pg (ref 26.6–33.0)
MCHC: 33.3 g/dL (ref 31.5–35.7)
MCV: 94 fL (ref 79–97)
Monocytes Absolute: 0.8 10*3/uL (ref 0.1–0.9)
Monocytes: 8 %
Neutrophils Absolute: 7.2 10*3/uL — ABNORMAL HIGH (ref 1.4–7.0)
Neutrophils: 75 %
Platelets: 306 10*3/uL (ref 150–450)
RBC: 3.78 x10E6/uL (ref 3.77–5.28)
RDW: 14.1 % (ref 11.7–15.4)
RPR Ser Ql: NONREACTIVE
WBC: 9.7 10*3/uL (ref 3.4–10.8)

## 2021-10-17 ENCOUNTER — Encounter: Payer: Self-pay | Admitting: Licensed Practical Nurse

## 2021-10-17 ENCOUNTER — Other Ambulatory Visit: Payer: Self-pay

## 2021-10-17 ENCOUNTER — Ambulatory Visit (INDEPENDENT_AMBULATORY_CARE_PROVIDER_SITE_OTHER): Payer: No Typology Code available for payment source | Admitting: Licensed Practical Nurse

## 2021-10-17 VITALS — BP 114/70 | Wt 170.0 lb

## 2021-10-17 DIAGNOSIS — Z23 Encounter for immunization: Secondary | ICD-10-CM

## 2021-10-17 DIAGNOSIS — Z3A30 30 weeks gestation of pregnancy: Secondary | ICD-10-CM

## 2021-10-17 DIAGNOSIS — Z3400 Encounter for supervision of normal first pregnancy, unspecified trimester: Secondary | ICD-10-CM

## 2021-10-17 LAB — POCT URINALYSIS DIPSTICK OB
Glucose, UA: NEGATIVE
POC,PROTEIN,UA: NEGATIVE

## 2021-10-17 NOTE — Progress Notes (Signed)
Routine Prenatal Care Visit  Subjective  Kristina [name] is a 26 y.o. G1P0 at [redacted]w[redacted]d being seen today for ongoing prenatal care.  She is currently monitored for the following issues for this low-risk pregnancy and has Supervision of normal first pregnancy, antepartum on their problem list.  ----------------------------------------------------------------------------------- Patient reports no complaints.  Doing well.  Mood is good.  Starting to get things ready for the baby.  Contractions: Not present. Vag. Bleeding: None.  Movement: Present. Leaking Fluid denies.  ----------------------------------------------------------------------------------- The following portions of the patient's history were reviewed and updated as appropriate: allergies, current medications, past family history, past medical history, past social history, past surgical history and problem list. Problem list updated.  Objective  Blood pressure 114/70, weight 170 lb (77.1 kg). Pregravid weight 142 lb (64.4 kg) Total Weight Gain 28 lb (12.7 kg) Urinalysis: Urine Protein    Urine Glucose    Fetal Status: Fetal Heart Rate (bpm): 138 Fundal Height: 30 cm Movement: Present     General:  Alert, oriented and cooperative. Patient is in no acute distress.  Skin: Skin is warm and dry. No rash noted.   Cardiovascular: Normal heart rate noted  Respiratory: Normal respiratory effort, no problems with respiration noted  Abdomen: Soft, gravid, appropriate for gestational age. Pain/Pressure: Absent     Pelvic:  Cervical exam deferred        Extremities: Normal range of motion.  Edema: None  Mental Status: Normal mood and affect. Normal behavior. Normal judgment and thought content.   Assessment   26 y.o. G1P0 at [redacted]w[redacted]d by  12/25/2021, by Ultrasound presenting for routine prenatal visit  Plan   pregnancy 1 Problems (from 06/04/21 to present)     Problem Noted Resolved   Supervision of normal first pregnancy, antepartum  06/04/2021 by Natale Milch, MD No   Overview Addendum 10/17/2021  8:33 AM by Ellwood Sayers, CNM     Nursing Staff Provider  Office Location  [facility] Dating  10 wk [place]  Language  English Anatomy US  Normal female- gender surprise  Flu Vaccine  Patient had at work Genetic Screen  NIPS: normal xx  TDaP vaccine   10/17/21 Hgb A1C or  GTT Early : hgba1c 5.7 Third trimester : 82  Covid vaccinated   LAB RESULTS   Rhogam  Not needed Blood Type O/Positive/-- (09/26 1506)   Feeding Plan breast Antibody Negative (01/25 1513)  Contraception POP Rubella 7.25 (09/26 1506)  Circumcision NA RPR Non Reactive (01/25 1513)   Pediatrician   HBsAg Negative (09/26 1506)   Support Person  FOB: HIV Non Reactive (01/25 1513)  Prenatal Classes Discussed Varicella  Immune    GBS  (For PCN allergy, check sensitivities)   BTL Consent     VBAC Consent  Pap  2022    Hgb Electro      CF negative     SMA negative                   Preterm labor symptoms and general obstetric precautions including but not limited to vaginal bleeding, contractions, leaking of fluid and fetal movement were reviewed in detail with the patient. Please refer to After Visit Summary for other counseling recommendations.   Return in about 2 weeks (around 10/31/2021) for ROB.  TDAP today   Carie Caddy, CNM  [facility] Pulse, [facility]  10/17/21  8:35 AM

## 2021-11-02 ENCOUNTER — Encounter: Payer: Self-pay | Admitting: Advanced Practice Midwife

## 2021-11-02 ENCOUNTER — Ambulatory Visit (INDEPENDENT_AMBULATORY_CARE_PROVIDER_SITE_OTHER): Payer: No Typology Code available for payment source | Admitting: Advanced Practice Midwife

## 2021-11-02 ENCOUNTER — Other Ambulatory Visit: Payer: Self-pay

## 2021-11-02 VITALS — BP 120/60 | Wt 177.0 lb

## 2021-11-02 DIAGNOSIS — Z3400 Encounter for supervision of normal first pregnancy, unspecified trimester: Secondary | ICD-10-CM

## 2021-11-02 DIAGNOSIS — Z3A32 32 weeks gestation of pregnancy: Secondary | ICD-10-CM

## 2021-11-02 LAB — POCT URINALYSIS DIPSTICK OB
Glucose, UA: NEGATIVE
POC,PROTEIN,UA: NEGATIVE

## 2021-11-02 NOTE — Progress Notes (Signed)
Routine Prenatal Care Visit  Subjective  Kristina [name] is a 26 y.o. G1P0 at [redacted]w[redacted]d being seen today for ongoing prenatal care.  She is currently monitored for the following issues for this low-risk pregnancy and has Supervision of normal first pregnancy, antepartum on their problem list.  ----------------------------------------------------------------------------------- Patient reports rare lower abdominal cramp. We discussed discomforts of third trimester/comfort measures and labor precautions.   Contractions: Not present. Vag. Bleeding: None.  Movement: Present. Leaking Fluid denies.  ----------------------------------------------------------------------------------- The following portions of the patient's history were reviewed and updated as appropriate: allergies, current medications, past family history, past medical history, past social history, past surgical history and problem list. Problem list updated.  Objective  Blood pressure 120/60, weight 177 lb (80.3 kg). Pregravid weight 142 lb (64.4 kg) Total Weight Gain 35 lb (15.9 kg) Urinalysis: Urine Protein Negative  Urine Glucose Negative  Fetal Status: Fetal Heart Rate (bpm): 137 Fundal Height: 33 cm Movement: Present     General:  Alert, oriented and cooperative. Patient is in no acute distress.  Skin: Skin is warm and dry. No rash noted.   Cardiovascular: Normal heart rate noted  Respiratory: Normal respiratory effort, no problems with respiration noted  Abdomen: Soft, gravid, appropriate for gestational age. Pain/Pressure: Absent     Pelvic:  Cervical exam deferred        Extremities: Normal range of motion.  Edema: None  Mental Status: Normal mood and affect. Normal behavior. Normal judgment and thought content.   Assessment   26 y.o. G1P0 at [redacted]w[redacted]d by  12/25/2021, by Ultrasound presenting for routine prenatal visit  Plan   pregnancy 1 Problems (from 06/04/21 to present)    Problem Noted Resolved   Supervision  of normal first pregnancy, antepartum 06/04/2021 by Natale Milch, MD No   Overview Addendum 10/17/2021  8:33 AM by Ellwood Sayers, CNM     Nursing Staff Provider  Office Location  [facility] Dating  10 wk [place]  Language  English Anatomy US  Normal female- gender surprise  Flu Vaccine  Patient had at work Genetic Screen  NIPS: normal xx  TDaP vaccine   10/17/21 Hgb A1C or  GTT Early : hgba1c 5.7 Third trimester : 82  Covid vaccinated   LAB RESULTS   Rhogam  Not needed Blood Type O/Positive/-- (09/26 1506)   Feeding Plan breast Antibody Negative (01/25 1513)  Contraception POP Rubella 7.25 (09/26 1506)  Circumcision NA RPR Non Reactive (01/25 1513)   Pediatrician   HBsAg Negative (09/26 1506)   Support Person  FOB: HIV Non Reactive (01/25 1513)  Prenatal Classes Discussed Varicella  Immune    GBS  (For PCN allergy, check sensitivities)   BTL Consent     VBAC Consent  Pap  2022    Hgb Electro      CF negative     SMA negative                  Preterm labor symptoms and general obstetric precautions including but not limited to vaginal bleeding, contractions, leaking of fluid and fetal movement were reviewed in detail with the patient. Please refer to After Visit Summary for other counseling recommendations.   Return in about 2 weeks (around 11/16/2021) for rob.  Tresea Mall, CNM 11/02/2021 4:14 PM

## 2021-11-02 NOTE — Patient Instructions (Signed)

## 2021-11-19 ENCOUNTER — Other Ambulatory Visit: Payer: Self-pay

## 2021-11-19 ENCOUNTER — Ambulatory Visit (INDEPENDENT_AMBULATORY_CARE_PROVIDER_SITE_OTHER): Payer: No Typology Code available for payment source | Admitting: Licensed Practical Nurse

## 2021-11-19 ENCOUNTER — Encounter: Payer: Self-pay | Admitting: Licensed Practical Nurse

## 2021-11-19 VITALS — BP 98/64 | Wt 179.4 lb

## 2021-11-19 DIAGNOSIS — Z3400 Encounter for supervision of normal first pregnancy, unspecified trimester: Secondary | ICD-10-CM

## 2021-11-19 DIAGNOSIS — Z3A34 34 weeks gestation of pregnancy: Secondary | ICD-10-CM

## 2021-11-19 LAB — POCT URINALYSIS DIPSTICK OB
Glucose, UA: NEGATIVE
POC,PROTEIN,UA: NEGATIVE

## 2021-11-19 NOTE — Patient Instructions (Signed)
?  Belly Mapping: ?https://www.spinningbabies.com ? ? ?Baby Books: ? ?Dr Rhetta Mura Portable Pediatrician  ?Dr Rhetta Mura Infant sleep book ? ?The happiest baby on the block by Dr Littie Deeds  ? ?

## 2021-11-19 NOTE — Progress Notes (Signed)
Routine Prenatal Care Visit ? ?Subjective  ?Kristina [name] is a 26 y.o. G1P0 at [redacted]w[redacted]d being seen today for ongoing prenatal care.  She is currently monitored for the following issues for this low-risk pregnancy and has Supervision of normal first pregnancy, antepartum on their problem list.  ?----------------------------------------------------------------------------------- ?Patient reports  occasional pelvic pain .  Doing well. Active fetus.  Watching a lot of videos about newborn care. Mood has been good, sleep is good too.  ?Contractions: Not present.  .  Movement: Present. Leaking Fluid denies.  ?----------------------------------------------------------------------------------- ?The following portions of the patient's history were reviewed and updated as appropriate: allergies, current medications, past family history, past medical history, past social history, past surgical history and problem list. Problem list updated. ? ?Objective  ?Blood pressure 98/64, weight 179 lb 6.4 oz (81.4 kg). ?Pregravid weight 142 lb (64.4 kg) Total Weight Gain 37 lb 6.4 oz (17 kg) ?Urinalysis: Urine Protein Negative  Urine Glucose Negative ? ?Fetal Status: Fetal Heart Rate (bpm): 135 Fundal Height: 35 cm Movement: Present    ? ?General:  Alert, oriented and cooperative. Patient is in no acute distress.  ?Skin: Skin is warm and dry. No rash noted.   ?Cardiovascular: Normal heart rate noted  ?Respiratory: Normal respiratory effort, no problems with respiration noted  ?Abdomen: Soft, gravid, appropriate for gestational age. Pain/Pressure: Absent     ?Pelvic:  Cervical exam deferred        ?Extremities: Normal range of motion.     ?Mental Status: Normal mood and affect. Normal behavior. Normal judgment and thought content.  ? ?Assessment  ? ?26 y.o. G1P0 at [redacted]w[redacted]d by  12/25/2021, by Ultrasound presenting for routine prenatal visit ? ?Plan  ? ?pregnancy 1 Problems (from 06/04/21 to present)   ? ? Problem Noted Resolved  ?  Supervision of normal first pregnancy, antepartum 06/04/2021 by Natale Milch, MD No  ? Overview Addendum 10/17/2021  8:33 AM by Ellwood Sayers, CNM  ?   ?Nursing Staff Provider  ?Office Location  [facility] Dating  10 wk [place]  ?Language  English Anatomy US  Normal female- gender surprise  ?Flu Vaccine  Patient had at work Genetic Screen  NIPS: normal xx  ?TDaP vaccine   10/17/21 Hgb A1C or  ?GTT Early : hgba1c 5.7 ?Third trimester : 67  ?Covid vaccinated   LAB RESULTS   ?Rhogam  Not needed Blood Type O/Positive/-- (09/26 1506)   ?Feeding Plan breast Antibody Negative (01/25 1513)  ?Contraception POP Rubella 7.25 (09/26 1506)  ?Circumcision NA RPR Non Reactive (01/25 1513)   ?Pediatrician   HBsAg Negative (09/26 1506)   ?Support Person  FOB: HIV Non Reactive (01/25 1513)  ?Prenatal Classes Discussed Varicella  Immune  ?  GBS  (For PCN allergy, check sensitivities)   ?BTL Consent     ?VBAC Consent  Pap  2022  ?  Hgb Electro    ?  CF negative  ?   SMA negative  ?     ? ? ?  ?  ? ?  ?  ? ?Preterm labor symptoms and general obstetric precautions including but not limited to vaginal bleeding, contractions, leaking of fluid and fetal movement were reviewed in detail with the patient. ?Please refer to After Visit Summary for other counseling recommendations.  ? ?Return for ROB 1-2 weeks , ROB. ? ?Carie Caddy, CNM  ?Domingo Pulse, [facility]  ?11/19/21  ?4:19 PM  ? ? ?

## 2021-11-19 NOTE — Progress Notes (Signed)
ROB- no concerns 

## 2021-11-28 ENCOUNTER — Ambulatory Visit (INDEPENDENT_AMBULATORY_CARE_PROVIDER_SITE_OTHER): Payer: No Typology Code available for payment source | Admitting: Family Medicine

## 2021-11-28 ENCOUNTER — Other Ambulatory Visit (HOSPITAL_COMMUNITY)
Admission: RE | Admit: 2021-11-28 | Discharge: 2021-11-28 | Disposition: A | Discharge status: Home / Self Care | Payer: Commercial Managed Care - PPO | Source: Ambulatory Visit | Attending: Family Medicine | Admitting: Family Medicine

## 2021-11-28 ENCOUNTER — Other Ambulatory Visit: Payer: Self-pay

## 2021-11-28 VITALS — BP 120/62 | Wt 186.0 lb

## 2021-11-28 DIAGNOSIS — Z3400 Encounter for supervision of normal first pregnancy, unspecified trimester: Secondary | ICD-10-CM

## 2021-11-28 DIAGNOSIS — Z3A36 36 weeks gestation of pregnancy: Secondary | ICD-10-CM

## 2021-11-28 LAB — POCT URINALYSIS DIPSTICK OB
Glucose, UA: NEGATIVE
POC,PROTEIN,UA: NEGATIVE

## 2021-11-28 NOTE — Progress Notes (Signed)
? ?  PRENATAL VISIT NOTE ? ?Subjective:  ?Kristina [name] is a 26 y.o. G1P0 at [redacted]w[redacted]d being seen today for ongoing prenatal care.  She is currently monitored for the following issues for this low-risk pregnancy and has Supervision of normal first pregnancy, antepartum on their problem list. ? ?Patient reports no complaints.  Contractions: Irritability. Vag. Bleeding: None.  Movement: Present. Denies leaking of fluid.  ? ?The following portions of the patient's history were reviewed and updated as appropriate: allergies, current medications, past family history, past medical history, past social history, past surgical history and problem list.  ? ?Objective:  ? ?Vitals:  ? 11/28/21 0829  ?BP: 120/62  ?Weight: 186 lb (84.4 kg)  ? ? ?Fetal Status: Fetal Heart Rate (bpm): 153 Fundal Height: 36 cm Movement: Present  Presentation: Vertex ? ?General:  Alert, oriented and cooperative. Patient is in no acute distress.  ?Skin: Skin is warm and dry. No rash noted.   ?Cardiovascular: Normal heart rate noted  ?Respiratory: Normal respiratory effort, no problems with respiration noted  ?Abdomen: Soft, gravid, appropriate for gestational age.  Pain/Pressure: Absent     ?Pelvic: Cervical exam deferred        ?Extremities: Normal range of motion.  Edema: None  ?Mental Status: Normal mood and affect. Normal behavior. Normal judgment and thought content.  ? ?Collected swabs with chaperone present ? ?Assessment and Plan:  ?Pregnancy: G1P0 at [redacted]w[redacted]d ? ?1. Supervision of normal first pregnancy, antepartum ?Reviewed plan to provider breastmilk ?Discussed labor and patient is reading about pregnancy and labor through app ?Reviewed plan for POP postpartum ?Patient having mild period like cramps which I reassured are normal at 36 weeks.  ?Discussed cultures today and patient agrees to collection ?- Culture, beta strep (group b only) ?- Cervicovaginal ancillary only ? ? ?Preterm labor symptoms and general obstetric precautions  including but not limited to vaginal bleeding, contractions, leaking of fluid and fetal movement were reviewed in detail with the patient. ?Please refer to After Visit Summary for other counseling recommendations.  ? ?Return in about 1 week (around 12/05/2021) for Routine prenatal care. ? ?Future Appointments  ?Date Time Provider Department Center  ?01/18/2022  3:00 PM Reubin Milan, MD [facility]  ? ? ?Federico Flake, MD ?

## 2021-11-29 LAB — CERVICOVAGINAL ANCILLARY ONLY
Chlamydia: NEGATIVE
Comment: NEGATIVE
Comment: NORMAL
Neisseria Gonorrhea: NEGATIVE

## 2021-12-02 LAB — CULTURE, BETA STREP (GROUP B ONLY): Strep Gp B Culture: NEGATIVE

## 2021-12-05 ENCOUNTER — Ambulatory Visit (INDEPENDENT_AMBULATORY_CARE_PROVIDER_SITE_OTHER): Payer: No Typology Code available for payment source | Admitting: Obstetrics

## 2021-12-05 VITALS — BP 120/70 | Wt 187.0 lb

## 2021-12-05 DIAGNOSIS — Z3400 Encounter for supervision of normal first pregnancy, unspecified trimester: Secondary | ICD-10-CM

## 2021-12-05 DIAGNOSIS — Z3A37 37 weeks gestation of pregnancy: Secondary | ICD-10-CM

## 2021-12-05 NOTE — Progress Notes (Signed)
Routine Prenatal Care Visit ? ?Subjective  ?Kristina [name] is a 26 y.o. G1P0 at [redacted]w[redacted]d being seen today for ongoing prenatal care.  She is currently monitored for the following issues for this low-risk pregnancy and has Supervision of normal first pregnancy, antepartum on their problem list.  ?----------------------------------------------------------------------------------- ?Patient reports no complaints.   ?Contractions: Not present. Vag. Bleeding: None.  Movement: Present. Leaking Fluid denies.  ?----------------------------------------------------------------------------------- ?The following portions of the patient's history were reviewed and updated as appropriate: allergies, current medications, past family history, past medical history, past social history, past surgical history and problem list. Problem list updated. ? ?Objective  ?Blood pressure 120/70, weight 187 lb (84.8 kg). ?Pregravid weight 142 lb (64.4 kg) Total Weight Gain 45 lb (20.4 kg) ?Urinalysis: Urine Protein    Urine Glucose   ? ?Fetal Status:     Movement: Present    ? ?General:  Alert, oriented and cooperative. Patient is in no acute distress.  ?Skin: Skin is warm and dry. No rash noted.   ?Cardiovascular: Normal heart rate noted  ?Respiratory: Normal respiratory effort, no problems with respiration noted  ?Abdomen: Soft, gravid, appropriate for gestational age. Pain/Pressure: Absent     ?Pelvic:  Cervical exam performed        ?Extremities: Normal range of motion.     ?Mental Status: Normal mood and affect. Normal behavior. Normal judgment and thought content.  ? ?Assessment  ? ?26 y.o. G1P0 at [redacted]w[redacted]d by  12/25/2021, by Ultrasound presenting for routine prenatal visit ? ?Plan  ? ?pregnancy 1 Problems (from 06/04/21 to present)   ? Problem Noted Resolved  ? Supervision of normal first pregnancy, antepartum 06/04/2021 by Natale Milch, MD No  ? Overview Addendum 10/17/2021  8:33 AM by Ellwood Sayers, CNM  ?   ?Nursing  Staff Provider  ?Office Location  [facility] Dating  10 wk [place]  ?Language  English Anatomy US  Normal female- gender surprise  ?Flu Vaccine  Patient had at work Genetic Screen  NIPS: normal xx  ?TDaP vaccine   10/17/21 Hgb A1C or  ?GTT Early : hgba1c 5.7 ?Third trimester : 63  ?Covid vaccinated   LAB RESULTS   ?Rhogam  Not needed Blood Type O/Positive/-- (09/26 1506)   ?Feeding Plan breast Antibody Negative (01/25 1513)  ?Contraception POP Rubella 7.25 (09/26 1506)  ?Circumcision NA RPR Non Reactive (01/25 1513)   ?Pediatrician   HBsAg Negative (09/26 1506)   ?Support Person  FOB: HIV Non Reactive (01/25 1513)  ?Prenatal Classes Discussed Varicella  Immune  ?  GBS  (For PCN allergy, check sensitivities)   ?BTL Consent     ?VBAC Consent  Pap  2022  ?  Hgb Electro    ?  CF negative  ?   SMA negative  ?     ? ? ?  ?  ?  ?  ? ?Term labor symptoms and general obstetric precautions including but not limited to vaginal bleeding, contractions, leaking of fluid and fetal movement were reviewed in detail with the patient. ?Please refer to After Visit Summary for other counseling recommendations.  ?Her GBS is negative. Discussed pain control o0ptions today, and how to tell lwhen she should go to the hospital for a labor check. [name] MA is her sisiter and may serve as her doula. ? ?Return in about 1 week (around 12/12/2021) for return OB. ? ?Mirna Mires, CNM  ?12/05/2021 4:03 PM   ? ?

## 2021-12-12 ENCOUNTER — Ambulatory Visit (INDEPENDENT_AMBULATORY_CARE_PROVIDER_SITE_OTHER): Payer: No Typology Code available for payment source | Admitting: Obstetrics

## 2021-12-12 VITALS — BP 110/68 | Wt 188.0 lb

## 2021-12-12 DIAGNOSIS — Z3A38 38 weeks gestation of pregnancy: Secondary | ICD-10-CM

## 2021-12-12 DIAGNOSIS — Z3400 Encounter for supervision of normal first pregnancy, unspecified trimester: Secondary | ICD-10-CM

## 2021-12-12 LAB — POCT URINALYSIS DIPSTICK OB
Glucose, UA: NEGATIVE
POC,PROTEIN,UA: NEGATIVE

## 2021-12-12 NOTE — Progress Notes (Signed)
Routine Prenatal Care Visit ? ?Subjective  ?Kristina [name] is a 26 y.o. G1P0 at [redacted]w[redacted]d being seen today for ongoing prenatal care.  She is currently monitored for the following issues for this low-risk pregnancy and has Supervision of normal first pregnancy, antepartum on their problem list. she would like to be checked today. ?--------------------------------------------- ?Contractions: Not present. Vag. Bleeding: None.  Movement: Present. Leaking Fluid denies.  ?----------------------------------------------------------------------------------- ?The following portions of the patient's history were reviewed and updated as appropriate: allergies, current medications, past family history, past medical history, past social history, past surgical history and problem list. Problem list updated. ? ?Objective  ?Blood pressure 110/68, weight 188 lb (85.3 kg). ?Pregravid weight 142 lb (64.4 kg) Total Weight Gain 46 lb (20.9 kg) ?Urinalysis: Urine Protein Negative  Urine Glucose Negative ? ?Fetal Status: Fetal Heart Rate (bpm): 145 Fundal Height: 37 cm Movement: Present  Presentation: Vertex ? ?General:  Alert, oriented and cooperative. Patient is in no acute distress.  ?Skin: Skin is warm and dry. No rash noted.   ?Cardiovascular: Normal heart rate noted  ?Respiratory: Normal respiratory effort, no problems with respiration noted  ?Abdomen: Soft, gravid, appropriate for gestational age. Pain/Pressure: Absent     ?Pelvic:  ft/thick/ballotable Dilation: Fingertip Effacement (%): Thick Station: Ballotable  ?Extremities: Normal range of motion.  Edema: None  ?Mental Status: Normal mood and affect. Normal behavior. Normal judgment and thought content.  ? ?Assessment  ? ?26 y.o. G1P0 at [redacted]w[redacted]d by  12/25/2021, by Ultrasound presenting for routine prenatal visit ? ?Plan  ? ?pregnancy 1 Problems (from 06/04/21 to present)   ? Problem Noted Resolved  ? Supervision of normal first pregnancy, antepartum 06/04/2021 by Natale Milch, MD No  ? Overview Addendum 10/17/2021  8:33 AM by Ellwood Sayers, CNM  ?   ?Nursing Staff Provider  ?Office Location  [facility] Dating  10 wk [place]  ?Language  English Anatomy US  Normal female- gender surprise  ?Flu Vaccine  Patient had at work Genetic Screen  NIPS: normal xx  ?TDaP vaccine   10/17/21 Hgb A1C or  ?GTT Early : hgba1c 5.7 ?Third trimester : 35  ?Covid vaccinated   LAB RESULTS   ?Rhogam  Not needed Blood Type O/Positive/-- (09/26 1506)   ?Feeding Plan breast Antibody Negative (01/25 1513)  ?Contraception POP Rubella 7.25 (09/26 1506)  ?Circumcision NA RPR Non Reactive (01/25 1513)   ?Pediatrician   HBsAg Negative (09/26 1506)   ?Support Person  FOB: HIV Non Reactive (01/25 1513)  ?Prenatal Classes Discussed Varicella  Immune  ?  GBS  (For PCN allergy, check sensitivities)   ?BTL Consent     ?VBAC Consent  Pap  2022  ?  Hgb Electro    ?  CF negative  ?   SMA negative  ?     ? ? ?  ?  ?  ?  ? ?Term labor symptoms and general obstetric precautions including but not limited to vaginal bleeding, contractions, leaking of fluid and fetal movement were reviewed in detail with the patient. ?Please refer to After Visit Summary for other counseling recommendations. We discussed signs of active labor; when to consider going to the hospital. ? ?Return in about 1 week (around 12/19/2021) for return OB. ? ?Mirna Mires, CNM  ?12/12/2021 8:39 AM   ? ?

## 2021-12-19 ENCOUNTER — Ambulatory Visit (INDEPENDENT_AMBULATORY_CARE_PROVIDER_SITE_OTHER): Payer: No Typology Code available for payment source | Admitting: Obstetrics and Gynecology

## 2021-12-19 ENCOUNTER — Encounter: Payer: No Typology Code available for payment source | Admitting: Advanced Practice Midwife

## 2021-12-19 ENCOUNTER — Encounter: Payer: Self-pay | Admitting: Obstetrics and Gynecology

## 2021-12-19 VITALS — BP 120/80 | Wt 190.0 lb

## 2021-12-19 DIAGNOSIS — Z3A39 39 weeks gestation of pregnancy: Secondary | ICD-10-CM

## 2021-12-19 DIAGNOSIS — Z3400 Encounter for supervision of normal first pregnancy, unspecified trimester: Secondary | ICD-10-CM

## 2021-12-19 NOTE — Progress Notes (Signed)
? ? ?  Routine Prenatal Care Visit ? ?Subjective  ?Kristina [name] is a 26 y.o. G1P0 at [redacted]w[redacted]d being seen today for ongoing prenatal care.  She is currently monitored for the following issues for this low-risk pregnancy and has Supervision of normal first pregnancy, antepartum on their problem list.  ?----------------------------------------------------------------------------------- ?Patient reports no complaints.   ?Contractions: Irritability. Vag. Bleeding: None.  Movement: Present. Denies leaking of fluid.  ?----------------------------------------------------------------------------------- ?The following portions of the patient's history were reviewed and updated as appropriate: allergies, current medications, past family history, past medical history, past social history, past surgical history and problem list. Problem list updated. ? ? ?Objective  ?Blood pressure 120/80, weight 190 lb (86.2 kg). ?Pregravid weight 142 lb (64.4 kg) Total Weight Gain 48 lb (21.8 kg) ?Urinalysis:     ? ?Fetal Status: Fetal Heart Rate (bpm): 140 Fundal Height: 38 cm Movement: Present    ? ?General:  Alert, oriented and cooperative. Patient is in no acute distress.  ?Skin: Skin is warm and dry. No rash noted.   ?Cardiovascular: Normal heart rate noted  ?Respiratory: Normal respiratory effort, no problems with respiration noted  ?Abdomen: Soft, gravid, appropriate for gestational age. Pain/Pressure: Present     ?Pelvic:  Cervical exam performed Dilation: Closed Effacement (%): Thick Station: Ballotable  ?Extremities: Normal range of motion.  Edema: None  ?Mental Status: Normal mood and affect. Normal behavior. Normal judgment and thought content.  ? ? ? ?Assessment  ? ?26 y.o. G1P0 at [redacted]w[redacted]d by  12/25/2021, by Ultrasound presenting for routine prenatal visit ? ?Plan  ? ?pregnancy 1 Problems (from 06/04/21 to present)   ? ? Problem Noted Resolved  ? Supervision of normal first pregnancy, antepartum 06/04/2021 by Natale Milch, MD No  ? Overview Addendum 12/19/2021  8:18 AM by Natale Milch, MD  ?   ?Nursing Staff Provider  ?Office Location  [facility] Dating  10 wk [place]  ?Language  English Anatomy US  Normal female- gender surprise  ?Flu Vaccine  Patient had at work Genetic Screen  NIPS: normal xx  ?TDaP vaccine   10/17/21 Hgb A1C or  ?GTT Early : hgba1c 5.7 ?Third trimester : 43  ?Covid vaccinated   LAB RESULTS   ?Rhogam  Not needed Blood Type O/Positive/-- (09/26 1506)   ?Feeding Plan breast Antibody Negative (01/25 1513)  ?Contraception POP Rubella 7.25 (09/26 1506)  ?Circumcision NA RPR Non Reactive (01/25 1513)   ?Pediatrician   HBsAg Negative (09/26 1506)   ?Support Person  FOB: HIV Non Reactive (01/25 1513)  ?Prenatal Classes Discussed Varicella  Immune  ?  GBS   negative  ?BTL Consent     ?VBAC Consent  Pap  2022  ?  Hgb Electro    ?  CF negative  ?   SMA negative  ?     ? ?  ?  ? ?  ?  ? ?Discussed options for IOL. ? ? ?Gestational age appropriate obstetric precautions including but not limited to vaginal bleeding, contractions, leaking of fluid and fetal movement were reviewed in detail with the patient.   ? ?Return in about 1 week (around 12/26/2021) for ROB in person. ? ?Natale Milch MD ?[facility], [facility] ?12/19/2021, 8:29 AM ? ? ? ?

## 2021-12-19 NOTE — Patient Instructions (Signed)
Vaginal Delivery ?Vaginal delivery means that you give birth by pushing your baby out of your birth canal (vagina). Your health care team will help you before, during, and after vaginal delivery. ?Birth experiences are unique for every woman and every pregnancy, and birth experiences vary depending on where you choose to give birth. ?What are the risks and benefits? ?Generally, this is safe. However, problems may occur, including: ?Bleeding. ?Infection. ?Damage to other structures such as vaginal tearing. ?Allergic reactions to medicines. ?Despite the risks, benefits of vaginal delivery include less risk of bleeding and infection and a shorter recovery time compared to a Cesarean delivery. Cesarean delivery, or C-section, is the surgical delivery of a baby. ?What happens when I arrive at the birth center or hospital? ?Once you are in labor and have been admitted into the hospital or birth center, your health care team may: ?Review your pregnancy history and any concerns that you have. ?Talk with you about your birth plan and discuss pain control options. ?Check your blood pressure, breathing, and heartbeat. ?Assess your baby's heartbeat. ?Monitor your uterus for contractions. ?Check whether your bag of water (amniotic sac) has broken (ruptured). ?Insert an IV into one of your veins. This may be used to give you fluids and medicines. ?Monitoring ?Your health care team may assess your contractions (uterine monitoring) and your baby's heart rate (fetal monitoring). You may need to be monitored: ?Often, but not continuously (intermittently). ?All the time or for long periods at a time (continuously). Continuous monitoring may be needed if: ?You are taking certain medicines, such as medicine to relieve pain or make your contractions stronger. ?You have pregnancy or labor complications. ?Monitoring may be done by: ?Placing a special stethoscope or a handheld monitoring device on your abdomen to check your baby's heartbeat  and to check for contractions. ?Placing monitors on your abdomen (external monitors) to record your baby's heartbeat and the frequency and length of contractions. ?Placing monitors inside your uterus through your vagina (internal monitors) to record your baby's heartbeat and the frequency, length, and strength of your contractions. Depending on the type of monitor, it may remain in your uterus or on your baby's head until birth. ?Telemetry. This is a type of continuous monitoring that can be done with external or internal monitors. Instead of having to stay in bed, you are able to move around. ?Physical exam ?Your health care team may perform frequent physical exams. This may include: ?Checking how and where your baby is positioned in your uterus. ?Checking your cervix to determine: ?Whether it is thinning out (effacing). ?Whether it is opening up (dilating). ?What happens during labor and delivery? ?Normal labor and delivery is divided into the following three stages: ?Stage 1 ?This is the longest stage of labor. ?Throughout this stage, you will feel contractions. Contractions generally feel mild, infrequent, and irregular at first. They get stronger, more frequent, and more regular as you move through this stage. You may have contractions about every 2-3 minutes. ?This stage ends when your cervix is completely dilated to 4 inches (10 cm) and completely effaced. ?Stage 2 ?This stage starts once your cervix is completely effaced and dilated and lasts until the delivery of your baby. ?This is the stage where you will feel an urge to push your baby out of your vagina. ?You may feel stretching and burning pain, especially when the widest part of your baby's head passes through the vaginal opening (crowning). ?Once your baby is delivered, the umbilical cord will be clamped and   cut. Timing of cutting the cord will depend on your wishes, your baby's health, and your health care provider's practices. ?Your baby will be  placed on your bare chest (skin-to-skin contact) in an upright position and covered with a warm blanket. If you are choosing to breastfeed, watch your baby for feeding cues, like rooting or sucking, and help the baby to your breast for his or her first feeding. ?Stage 3 ?This stage starts immediately after the birth of your baby and ends after you deliver the placenta. ?This stage may take anywhere from 5 to 30 minutes. ?After your baby has been delivered, you will feel contractions as your body expels the placenta. These contractions also help your uterus get smaller and reduce bleeding. ?What can I expect after labor and delivery? ?After labor is over, you and your baby will be assessed closely until you are ready to go home. Your health care team will teach you how to care for yourself and your baby. ?You and your baby may be encouraged to stay in the same room (rooming in) during your hospital stay. This will help promote early bonding and successful breastfeeding. ?Your uterus will be checked and massaged regularly (fundal massage). ?You may continue to receive fluids and medicines through an IV. ?You will have some soreness and pain in your abdomen, vagina, and the area of skin between your vaginal opening and your anus (perineum). ?If an incision was made near your vagina (episiotomy) or if you had some vaginal tearing during delivery, cold compresses may be placed on your episiotomy or your tear. This helps to reduce pain and swelling. ?It is normal to have vaginal bleeding after delivery. Wear a sanitary pad for vaginal bleeding and discharge. ?Summary ?Vaginal delivery means that you will give birth by pushing your baby out of your birth canal (vagina). ?Your health care team will monitor you and your baby throughout the stages of labor. ?After you deliver your baby, your health care team will continue to assess you and your baby to ensure you are both recovering as expected after delivery. ?This  information is not intended to replace advice given to you by your health care provider. Make sure you discuss any questions you have with your health care provider. ?Document Revised: 07/24/2020 Document Reviewed: 07/24/2020 ?Elsevier Patient Education ? 2022 [employer] ?Pain Relief During Labor and Delivery ?Many things can cause pain during labor and delivery, including: ?Pressure due to the baby moving through the pelvis. ?Stretching of tissues due to the baby moving through the birth canal. ?Muscle tension due to anxiety or nervousness. ?The uterus tightening (contracting)and relaxing to help move the baby. ?How do I get pain relief during labor and delivery? ?Discuss your pain relief options with your health care provider during your prenatal visits. Explore the options offered by your hospital or birth center. There are many ways to deal with the pain of labor and delivery. You can try relaxation techniques or doing relaxing activities, taking a warm shower or bath (hydrotherapy), or other methods. There are also many medicines available to help control pain. ?Relaxation techniques and activities ?Practice relaxation techniques or do relaxing activities, such as: ?Focused breathing. ?Meditation. ?Visualization. ?Aroma therapy. ?Listening to your favorite music. ?Hypnosis. ?Hydrotherapy ?Take a warm shower or bath. This may: ?Provide comfort and relaxation. ?Lessen your feeling of pain. ?Reduce the amount of pain medicine needed. ?Shorten the length of labor. ?Other methods ?Try doing other things, such as: ?Getting a massage or having counterpressure on  your back. ?Applying warm packs or ice packs. ?Changing positions often, moving around, or using a birthing ball. ?Medicines ?You may be given: ?Pain medicine through an IV or an injection into a muscle. ?Pain medicine inserted into your spinal column. ?Injections of sterile water just under the skin on your lower back. ?Nitrous oxide inhalation therapy,  also called laughing gas. ?What kinds of medicine are available for pain relief? ?There are two kinds of medicines that can be used to relieve pain during labor and delivery: ?Analgesics. These medicines decrease

## 2021-12-21 ENCOUNTER — Encounter: Payer: Self-pay | Admitting: Obstetrics and Gynecology

## 2021-12-26 ENCOUNTER — Ambulatory Visit (INDEPENDENT_AMBULATORY_CARE_PROVIDER_SITE_OTHER): Payer: No Typology Code available for payment source | Admitting: Licensed Practical Nurse

## 2021-12-26 ENCOUNTER — Other Ambulatory Visit: Payer: Self-pay

## 2021-12-26 ENCOUNTER — Inpatient Hospital Stay
Admission: EM | Admit: 2021-12-26 | Discharge: 2021-12-30 | DRG: 805 | Disposition: A | Discharge status: Home / Self Care | Payer: No Typology Code available for payment source | Attending: Advanced Practice Midwife | Admitting: Advanced Practice Midwife

## 2021-12-26 ENCOUNTER — Encounter: Payer: Self-pay | Admitting: Licensed Practical Nurse

## 2021-12-26 ENCOUNTER — Encounter: Payer: Self-pay | Admitting: Obstetrics and Gynecology

## 2021-12-26 VITALS — BP 120/80 | Wt 196.0 lb

## 2021-12-26 DIAGNOSIS — Z3A40 40 weeks gestation of pregnancy: Secondary | ICD-10-CM

## 2021-12-26 DIAGNOSIS — O411230 Chorioamnionitis, third trimester, not applicable or unspecified: Secondary | ICD-10-CM | POA: Diagnosis present

## 2021-12-26 DIAGNOSIS — Z3400 Encounter for supervision of normal first pregnancy, unspecified trimester: Secondary | ICD-10-CM

## 2021-12-26 DIAGNOSIS — O480 Post-term pregnancy: Principal | ICD-10-CM | POA: Diagnosis present

## 2021-12-26 DIAGNOSIS — O479 False labor, unspecified: Secondary | ICD-10-CM | POA: Diagnosis present

## 2021-12-26 NOTE — OB Triage Note (Signed)
Pt is a G1P0 at [redacted]w[redacted]d presenting to L&D triage c/o ctx q5 mins that began around noon and has gotten progressively more intense. Pt rates ctx 10/10. Pt also endorses having lower back pain rating 10/10. Pt denies LOF and vaginal bleeding. +FM. VSS. Monitors applied and assessing.  ?

## 2021-12-26 NOTE — Progress Notes (Signed)
Routine Prenatal Care Visit ? ?Subjective  ?Kristina [name] is a 26 y.o. G1P0 at [redacted]w[redacted]d being seen today for ongoing prenatal care.  She is currently monitored for the following issues for this low-risk pregnancy and has Supervision of normal first pregnancy, antepartum on their problem list.  ?----------------------------------------------------------------------------------- ?Patient reports no complaints.  Doing well. Ready to meet this baby.  Admits to feeling a little nervous about the unexpected r/t to birth, but ready.  ?Contractions: Irritability. Vag. Bleeding: None.  Movement: Present. Leaking Fluid denies.  ?----------------------------------------------------------------------------------- ?The following portions of the patient's history were reviewed and updated as appropriate: allergies, current medications, past family history, past medical history, past social history, past surgical history and problem list. Problem list updated. ? ?Objective  ?Blood pressure 120/80, weight 196 lb (88.9 kg). ?Pregravid weight 142 lb (64.4 kg) Total Weight Gain 54 lb (24.5 kg) ?Urinalysis: Urine Protein    Urine Glucose   ? ?Fetal Status:     Movement: Present    ? ?General:  Alert, oriented and cooperative. Patient is in no acute distress.  ?Skin: Skin is warm and dry. No rash noted.   ?Cardiovascular: Normal heart rate noted  ?Respiratory: Normal respiratory effort, no problems with respiration noted  ?Abdomen: Soft, gravid, appropriate for gestational age. Pain/Pressure: Present     ?Pelvic:  Cervical exam performed      1/50/-3, swept   ?Extremities: Normal range of motion.     ?Mental Status: Normal mood and affect. Normal behavior. Normal judgment and thought content.  ? ?Assessment  ? ?26 y.o. G1P0 at [redacted]w[redacted]d by  12/25/2021, by Ultrasound presenting for routine prenatal visit ? ?Plan  ? ?pregnancy 1 Problems (from 06/04/21 to present)   ? ? Problem Noted Resolved  ? Supervision of normal first pregnancy,  antepartum 06/04/2021 by Natale Milch, MD No  ? Overview Addendum 12/26/2021  8:33 AM by Ellwood Sayers, CNM  ?   ?Nursing Staff Provider  ?Office Location  [facility] Dating  10 wk [place]  ?Language  English Anatomy US  Normal female- gender surprise  ?Flu Vaccine  Patient had at work Genetic Screen  NIPS: normal xx  ?TDaP vaccine   10/17/21 Hgb A1C or  ?GTT Early : hgba1c 5.7 ?Third trimester : 83  ?Covid vaccinated   LAB RESULTS   ?Rhogam  Not needed Blood Type O/Positive/-- (09/26 1506)   ?Feeding Plan breast Antibody Negative (01/25 1513)  ?Contraception POP Rubella 7.25 (09/26 1506)  ?Circumcision NA RPR Non Reactive (01/25 1513)   ?Pediatrician  [facility]  HBsAg Negative (09/26 1506)   ?Support Person  FOB: HIV Non Reactive (01/25 1513)  ?Prenatal Classes Discussed Varicella  Immune  ?  GBS   negative  ?BTL Consent     ?VBAC Consent  Pap  2022  ?  Hgb Electro    ?  CF negative  ?   SMA negative  ?     ? ? ?  ?  ? ?  ?  ? ?Term labor symptoms and general obstetric precautions including but not limited to vaginal bleeding, contractions, leaking of fluid and fetal movement were reviewed in detail with the patient. ?Please refer to After Visit Summary for other counseling recommendations.  ? ?Return for IOL  on Saturday . ? ?Carie Caddy, CNM  ?[facility], [facility]  ?12/26/21  ?10:48 AM  ? ? ?

## 2021-12-27 ENCOUNTER — Inpatient Hospital Stay: Payer: No Typology Code available for payment source | Admitting: Anesthesiology

## 2021-12-27 DIAGNOSIS — O479 False labor, unspecified: Secondary | ICD-10-CM | POA: Diagnosis present

## 2021-12-27 DIAGNOSIS — O480 Post-term pregnancy: Secondary | ICD-10-CM | POA: Diagnosis present

## 2021-12-27 DIAGNOSIS — O411230 Chorioamnionitis, third trimester, not applicable or unspecified: Secondary | ICD-10-CM | POA: Diagnosis present

## 2021-12-27 DIAGNOSIS — Z3A40 40 weeks gestation of pregnancy: Secondary | ICD-10-CM | POA: Diagnosis not present

## 2021-12-27 DIAGNOSIS — O26893 Other specified pregnancy related conditions, third trimester: Secondary | ICD-10-CM | POA: Diagnosis present

## 2021-12-27 LAB — CBC
HCT: 37 % (ref 36.0–46.0)
Hemoglobin: 12.4 g/dL (ref 12.0–15.0)
MCH: 31.4 pg (ref 26.0–34.0)
MCHC: 33.5 g/dL (ref 30.0–36.0)
MCV: 93.7 fL (ref 80.0–100.0)
Platelets: 341 10*3/uL (ref 150–400)
RBC: 3.95 MIL/uL (ref 3.87–5.11)
RDW: 13.2 % (ref 11.5–15.5)
WBC: 14.8 10*3/uL — ABNORMAL HIGH (ref 4.0–10.5)
nRBC: 0 % (ref 0.0–0.2)

## 2021-12-27 LAB — TYPE AND SCREEN
ABO/RH(D): O POS
Antibody Screen: NEGATIVE

## 2021-12-27 LAB — RPR: RPR Ser Ql: NONREACTIVE

## 2021-12-27 LAB — ABO/RH: ABO/RH(D): O POS

## 2021-12-27 MED ORDER — BUTORPHANOL TARTRATE 1 MG/ML IJ SOLN
1.0000 mg | INTRAMUSCULAR | Status: DC | PRN
  Administered 2021-12-27: 1 mg via INTRAVENOUS
  Filled 2021-12-27: qty 1

## 2021-12-27 MED ORDER — OXYTOCIN BOLUS FROM INFUSION
333.0000 mL | Freq: Once | INTRAVENOUS | Status: AC
  Administered 2021-12-28: 333 mL via INTRAVENOUS

## 2021-12-27 MED ORDER — LIDOCAINE HCL (PF) 1 % IJ SOLN
INTRAMUSCULAR | Status: DC | PRN
  Administered 2021-12-27: 3 mL

## 2021-12-27 MED ORDER — FENTANYL-BUPIVACAINE-NACL 0.5-0.125-0.9 MG/250ML-% EP SOLN
EPIDURAL | Status: AC
  Filled 2021-12-27: qty 250

## 2021-12-27 MED ORDER — OXYTOCIN-SODIUM CHLORIDE 30-0.9 UT/500ML-% IV SOLN
2.5000 [IU]/h | INTRAVENOUS | Status: DC
  Administered 2021-12-28: 2.5 [IU]/h via INTRAVENOUS
  Filled 2021-12-27: qty 500

## 2021-12-27 MED ORDER — OXYTOCIN 10 UNIT/ML IJ SOLN
INTRAMUSCULAR | Status: AC
  Filled 2021-12-27: qty 2

## 2021-12-27 MED ORDER — AMMONIA AROMATIC IN INHA
RESPIRATORY_TRACT | Status: AC
  Filled 2021-12-27: qty 10

## 2021-12-27 MED ORDER — OXYCODONE-ACETAMINOPHEN 5-325 MG PO TABS: 1.0000 | ORAL_TABLET | ORAL | Status: DC | PRN

## 2021-12-27 MED ORDER — SOD CITRATE-CITRIC ACID 500-334 MG/5ML PO SOLN: 30.0000 mL | ORAL | Status: DC | PRN

## 2021-12-27 MED ORDER — LACTATED RINGERS IV SOLN: INTRAVENOUS | Status: DC

## 2021-12-27 MED ORDER — PHENYLEPHRINE 80 MCG/ML (10ML) SYRINGE FOR IV PUSH (FOR BLOOD PRESSURE SUPPORT): 80.0000 ug | PREFILLED_SYRINGE | INTRAVENOUS | Status: DC | PRN

## 2021-12-27 MED ORDER — OXYCODONE-ACETAMINOPHEN 5-325 MG PO TABS: 2.0000 | ORAL_TABLET | ORAL | Status: DC | PRN

## 2021-12-27 MED ORDER — DIPHENHYDRAMINE HCL 50 MG/ML IJ SOLN
INTRAMUSCULAR | Status: AC
  Administered 2021-12-27: 50 mg via INTRAVENOUS
  Filled 2021-12-27: qty 1

## 2021-12-27 MED ORDER — DIPHENHYDRAMINE HCL 50 MG/ML IJ SOLN: 50.0000 mg | Freq: Once | INTRAMUSCULAR | Status: AC

## 2021-12-27 MED ORDER — OXYTOCIN-SODIUM CHLORIDE 30-0.9 UT/500ML-% IV SOLN: 1.0000 m[IU]/min | INTRAVENOUS | Status: DC

## 2021-12-27 MED ORDER — DIPHENHYDRAMINE HCL 50 MG/ML IJ SOLN: 12.5000 mg | INTRAMUSCULAR | Status: DC | PRN

## 2021-12-27 MED ORDER — LACTATED RINGERS IV SOLN
500.0000 mL | Freq: Once | INTRAVENOUS | Status: AC
  Administered 2021-12-27: 500 mL via INTRAVENOUS

## 2021-12-27 MED ORDER — LACTATED RINGERS IV SOLN
500.0000 mL | INTRAVENOUS | Status: DC | PRN
  Administered 2021-12-27 (×2): 500 mL via INTRAVENOUS
  Administered 2021-12-28: 250 mL via INTRAVENOUS

## 2021-12-27 MED ORDER — ONDANSETRON HCL 4 MG/2ML IJ SOLN
4.0000 mg | Freq: Four times a day (QID) | INTRAMUSCULAR | Status: DC | PRN
  Administered 2021-12-27: 4 mg via INTRAVENOUS
  Filled 2021-12-27: qty 2

## 2021-12-27 MED ORDER — TERBUTALINE SULFATE 1 MG/ML IJ SOLN: 0.2500 mg | Freq: Once | INTRAMUSCULAR | Status: DC | PRN

## 2021-12-27 MED ORDER — LIDOCAINE HCL (PF) 1 % IJ SOLN
INTRAMUSCULAR | Status: AC
  Administered 2021-12-28: 30 mL via SUBCUTANEOUS
  Filled 2021-12-27: qty 30

## 2021-12-27 MED ORDER — BUPIVACAINE HCL (PF) 0.25 % IJ SOLN
INTRAMUSCULAR | Status: DC | PRN
Start: 2021-12-27 — End: 2021-12-28
  Administered 2021-12-27 (×2): 4 mL via EPIDURAL

## 2021-12-27 MED ORDER — FENTANYL-BUPIVACAINE-NACL 0.5-0.125-0.9 MG/250ML-% EP SOLN
EPIDURAL | Status: DC | PRN
  Administered 2021-12-27: 12 mL/h via EPIDURAL
  Administered 2021-12-28: 500 ug via EPIDURAL

## 2021-12-27 MED ORDER — ACETAMINOPHEN 325 MG PO TABS
650.0000 mg | ORAL_TABLET | ORAL | Status: DC | PRN
  Filled 2021-12-27: qty 2

## 2021-12-27 MED ORDER — FENTANYL-BUPIVACAINE-NACL 0.5-0.125-0.9 MG/250ML-% EP SOLN
12.0000 mL/h | EPIDURAL | Status: DC | PRN
  Filled 2021-12-27: qty 250

## 2021-12-27 MED ORDER — EPHEDRINE 5 MG/ML INJ: 10.0000 mg | INTRAVENOUS | Status: DC | PRN

## 2021-12-27 MED ORDER — ACETAMINOPHEN 10 MG/ML IV SOLN
1000.0000 mg | Freq: Once | INTRAVENOUS | Status: AC
  Administered 2021-12-27: 1000 mg via INTRAVENOUS
  Filled 2021-12-27: qty 100

## 2021-12-27 MED ORDER — LIDOCAINE-EPINEPHRINE (PF) 1.5 %-1:200000 IJ SOLN
INTRAMUSCULAR | Status: DC | PRN
Start: 2021-12-27 — End: 2021-12-28
  Administered 2021-12-27: 3 mL via EPIDURAL

## 2021-12-27 MED ORDER — LIDOCAINE HCL (PF) 1 % IJ SOLN: 30.0000 mL | INTRAMUSCULAR | Status: AC | PRN

## 2021-12-27 MED ORDER — MISOPROSTOL 200 MCG PO TABS
ORAL_TABLET | ORAL | Status: AC
  Filled 2021-12-27: qty 4

## 2021-12-27 MED ORDER — OXYTOCIN-SODIUM CHLORIDE 30-0.9 UT/500ML-% IV SOLN
INTRAVENOUS | Status: AC
  Administered 2021-12-27: 1 m[IU]/min via INTRAVENOUS
  Filled 2021-12-27: qty 500

## 2021-12-27 NOTE — Progress Notes (Signed)
?  Labor Progress Note  ? ?Visit at 1700 ? ?26 y.o. G1P0 @ [redacted]w[redacted]d , admitted for  ?Pregnancy, Labor Management.  ? ?Subjective:  ?Feeling some tightness and mild pain with contractions ? ?Objective:  ?BP 106/61 (BP Location: Right Arm)   Pulse 89   Temp 99.2 ?F (37.3 ?C) (Oral)   Resp 16   Ht 5\' 3"  (1.6 m)   Wt 88.9 kg   LMP  (LMP Unknown)   SpO2 99%   BMI 34.72 kg/m?  ?Abd: gravid, ND, FHT present, mild tenderness on exam ?Extr: no edema ?SVE: CERVIX: 9 cm dilated, mildly edematous, 0 station ? ?EFM: FHR: 135 bpm, variability: moderate,  accelerations:  Present,  decelerations:  Absent ?Toco: Frequency: Every 2-4 minutes ?Labs: I have reviewed the patient's lab results. ? ? ?Assessment & Plan:  ?G1P0 @ [redacted]w[redacted]d, admitted for  ?Pregnancy and Labor/Delivery Management ? ?1. Pain management: epidural. ?2. FWB: FHT category I.  ?3. ID: GBS negative ?4. Labor management: continue pitocin titration and position changes ? ?All discussed with patient, see orders ? ? ?[redacted]w[redacted]d, CNM ?[facility] ?[facility] ?12/27/2021  7:45 PM ? ?

## 2021-12-27 NOTE — Progress Notes (Signed)
?  Labor Progress Note  ? ?27 y.o. G1P0 @ [redacted]w[redacted]d , admitted for  ?Pregnancy, Labor Management.  ? ?Subjective:  ?Comfortable with epidural. Her sister and partner are in the room supporting. ? ?Objective:  ?BP 107/70   Pulse 92   Temp 98.2 ?F (36.8 ?C) (Oral)   Resp 18   Ht 5\' 3"  (1.6 m)   Wt 88.9 kg   LMP  (LMP Unknown)   SpO2 99%   BMI 34.72 kg/m?  ?Abd: gravid, ND, FHT present, mild tenderness on exam ?Extr: no edema ?SVE: CERVIX: 8.5 cm dilated, 100 effaced, -2 station ? ?EFM: FHR: 145 bpm, variability: moderate,  accelerations:  Present,  decelerations:  Present earlies ?Toco: Frequency: Every 3 minutes ?Labs: I have reviewed the patient's lab results. ? ? ?Assessment & Plan:  ?G1P0 @ [redacted]w[redacted]d, admitted for  ?Pregnancy and Labor/Delivery Management ? ?1. Pain management: epidural. ?2. FWB: FHT category I.  ?3. ID: GBS negative ?4. Labor management: expectant management  ? ?All discussed with patient, see orders ? ? ?[redacted]w[redacted]d, CNM ?[facility] ?[facility] ?12/27/2021  10:53 AM ? ?

## 2021-12-27 NOTE — Progress Notes (Signed)
Kristina [name] is a 26 y.o. G1P0 at [redacted]w[redacted]d by ultrasound admitted for regular contractions at term. ? ?Subjective:  ?Her sister, Kristina [name] is serving as her doula. Husband also present and supportive. [name] admits her contractions have spaced apart quite a bit. She uses breathing and her sister's coaching during contractions. Recently had one episode of emesis. She had indicated a preference for less medication during her labor, but has also recently verbalized she may desire epidural anesthesia. ? ?Objective: ?BP 123/76 (BP Location: Left Arm)   Pulse (!) 114   Temp 98.2 ?F (36.8 ?C) (Oral)   Resp 18   Ht 5\' 3"  (1.6 m)   Wt 88.9 kg   LMP  (LMP Unknown)   BMI 34.72 kg/m?  ?I/O last 3 completed shifts: ?In: 935 [P.O.:600; I.V.:335] ?Out: -  ?No intake/output data recorded. ? ?FHT:  FHR: 135 baseline bpm, variability: moderate,  accelerations:  Present,  decelerations:  Present some non repetitive variablesnoted with contractions. ?UC:   irregular, every 2-8 minutes ?SVE:   Dilation: 6 ?Effacement (%): 100 ?Station: -3 ?Exam by:: [email or web address], CNM ?BBOW noted with exam ?Labs: ?Lab Results  ?Component Value Date  ? WBC 14.8 (H) 12/27/2021  ? HGB 12.4 12/27/2021  ? HCT 37.0 12/27/2021  ? MCV 93.7 12/27/2021  ? PLT 341 12/27/2021  ? ? ?Assessment / Plan: ?Spontaneous labor, progressing normally ?Now augmenting with low dose pitocin. Consider AROM once contracting more regularly ? ?Labor: Progressing normally ? ?Fetal Wellbeing:  Category II ?Pain Control:  IV pain meds ?I/D:  n/a ?Anticipated MOD:  NSVD ? ?12/29/2021 ?12/27/2021, 7:44 AM ? ? ?

## 2021-12-27 NOTE — Anesthesia Preprocedure Evaluation (Signed)
Anesthesia Evaluation  ?Patient identified by MRN, date of birth, ID band ?Patient awake ? ? ? ?Reviewed: ?Allergy & Precautions, H&P , NPO status , Patient's Chart, lab work & pertinent test results ? ?Airway ?Mallampati: II ? ?TM Distance: >3 FB ?Neck ROM: full ? ? ? Dental ?no notable dental hx. ? ?  ?Pulmonary ?neg pulmonary ROS,  ?  ?Pulmonary exam normal ? ? ? ? ? ? ? Cardiovascular ?negative cardio ROS ?Normal cardiovascular exam ? ? ?  ?Neuro/Psych ?negative neurological ROS ? negative psych ROS  ? GI/Hepatic ?negative GI ROS, Neg liver ROS,   ?Endo/Other  ?negative endocrine ROS ? Renal/GU ?negative Renal ROS  ?negative genitourinary ?  ?Musculoskeletal ?negative musculoskeletal ROS ?(+)  ? Abdominal ?  ?Peds ?negative pediatric ROS ?(+)  Hematology ?negative hematology ROS ?(+)   ?Anesthesia Other Findings ? ? Reproductive/Obstetrics ?(+) Pregnancy ? ?  ? ? ? ? ? ? ? ? ? ? ? ? ? ?  ?  ? ? ? ? ? ? ? ? ?Anesthesia Physical ?Anesthesia Plan ? ?ASA: 2 ? ?Anesthesia Plan: Epidural  ? ?Post-op Pain Management: Epidural*  ? ?Induction:  ? ?PONV Risk Score and Plan:  ? ?Airway Management Planned:  ? ?Additional Equipment:  ? ?Intra-op Plan:  ? ?Post-operative Plan:  ? ?Informed Consent: I have reviewed the patients History and Physical, chart, labs and discussed the procedure including the risks, benefits and alternatives for the proposed anesthesia with the patient or authorized representative who has indicated his/her understanding and acceptance.  ? ? ? ? ? ?Plan Discussed with: Anesthesiologist and CRNA ? ?Anesthesia Plan Comments:   ? ? ? ? ? ? ?Anesthesia Quick Evaluation ? ?

## 2021-12-27 NOTE — H&P (Signed)
Kristina [name] is a 26 y.o. female presenting with painful contractions since before midnight. She was seen in the office yesterday and had a cervical "sweep". After a number of hours , her contractions becams more frequent and she reports they have increased in intensity. She denies any leaking of fluid or vaginal bleeding. Her baby has been moving well. At her OB appointment yesterday, she consiented to an IOL which was set for this coming Saturday. ?OB History   ? ? Gravida  ?1  ? Para  ?   ? Term  ?   ? Preterm  ?   ? AB  ?   ? Living  ?   ?  ? ? SAB  ?   ? IAB  ?   ? Ectopic  ?   ? Multiple  ?   ? Live Births  ?   ?   ?  ?  ? ?Past Medical History:  ?Diagnosis Date  ? No pertinent past medical history   ? ?Past Surgical History:  ?Procedure Laterality Date  ? WISDOM TOOTH EXTRACTION    ? ?Family History: family history includes Hypertension in her mother. ?Social History:  reports that she has never smoked. She has never used smokeless tobacco. She reports that she does not drink alcohol and does not use drugs. ? ? ?  ?Maternal Diabetes: No ?Genetic Screening: Normal ?Maternal Ultrasounds/Referrals: Normal ?Fetal Ultrasounds or other Referrals:  None ?Maternal Substance Abuse:  No ?Significant Maternal Medications:  None ?Significant Maternal Lab Results:  Group B Strep negative ?Other Comments:  None ? ?Review of Systems  ?Constitutional: Negative.   ?HENT: Negative.    ?Eyes: Negative.   ?Respiratory: Negative.    ?Cardiovascular: Negative.   ?Gastrointestinal: Negative.   ?     Gravid abdomen  ?Endocrine: Negative.   ?Genitourinary: Negative.   ?Musculoskeletal: Negative.   ?Allergic/Immunologic: Negative.   ?Neurological: Negative.   ?Hematological: Negative.   ?Psychiatric/Behavioral: Negative.    ?History ?Cervical exam: 4 cms/100%/-3 ?Cervix is anterioir with a palpable forebag. ?Blood pressure 111/72, pulse (!) 102, temperature 98.2 ?F (36.8 ?C), temperature source Oral, resp. rate 18, height  5\' 3"  (1.6 m), weight 88.9 kg. ?Maternal Exam:  ?Introitus: Normal vulva.  ?Physical Exam ?Constitutional:   ?   Appearance: Normal appearance. She is obese.  ?HENT:  ?   Head: Normocephalic and atraumatic.  ?Cardiovascular:  ?   Rate and Rhythm: Normal rate and regular rhythm.  ?   Pulses: Normal pulses.  ?   Heart sounds: Normal heart sounds.  ?Pulmonary:  ?   Effort: Pulmonary effort is normal.  ?   Breath sounds: Normal breath sounds.  ?Abdominal:  ?   Comments: Gravid,  ?Genitourinary: ?   General: Normal vulva.  ?   Rectum: Normal.  ?   Comments: SVE: 4 cms/100%/-3, vertex. Cervix is anterior. ?Musculoskeletal:     ?   General: Normal range of motion.  ?   Cervical back: Normal range of motion and neck supple.  ?Skin: ?   General: Skin is warm and dry.  ?Neurological:  ?   General: No focal deficit present.  ?   Mental Status: She is oriented to person, place, and time.  ?Psychiatric:     ?   Mood and Affect: Mood normal.     ?   Behavior: Behavior normal.  ?  ?Prenatal labs: ?ABO, Rh: O/Positive/-- (09/26 1506) ?Antibody: Negative (01/25 1513) ?Rubella: 7.25 (09/26 1506) ?RPR: Non Reactive (  01/25 1513)  ?HBsAg: Negative (09/26 1506)  ?HIV: Non Reactive (01/25 1513)  ?GBS: Negative/-- (03/22 0913)  ? ?Assessment/Plan: ?IUP 40 weeks 2 days ?Irregular contractions, + cervical change over last few hours. ?Plan: Will admit, routine labs, IV access EFM ?Recheck in several hours- consider augmentation with pitocin or AROM per patient desire ?Anticipate NSVD.  ? ?Mirna Mires ?12/27/2021, 2:50 AM ? ? ? ? ?

## 2021-12-27 NOTE — Progress Notes (Signed)
?  Labor Progress Note  ? ?26 y.o. G1P0 @ [redacted]w[redacted]d , admitted for  ?Pregnancy, Labor Management.  ? ?Subjective:  ?Comfortable with epidural ? ?Objective:  ?BP 109/78   Pulse 92   Temp 98.2 ?F (36.8 ?C) (Oral)   Resp 18   Ht 5\' 3"  (1.6 m)   Wt 88.9 kg   LMP  (LMP Unknown)   SpO2 100%   BMI 34.72 kg/m?  ?Abd: gravid, ND, FHT present, mild tenderness on exam ?Extr: no edema ?SVE: CERVIX: 8.5 cm dilated, edematous effaced, -1 station ? ?EFM: FHR: 135 bpm, variability: moderate,  accelerations: present,  decelerations:  Absent ?Toco: Frequency: Every 2 minutes ?Labs: I have reviewed the patient's lab results. ? ? ?Assessment & Plan:  ?G1P0 @ [redacted]w[redacted]d, admitted for  ?Pregnancy and Labor/Delivery Management ? ?1. Pain management: epidural. ?2. FWB: FHT category I.  ?3. ID: GBS negative ?4. Labor management: IV benadryl/trendelenburg to reduce cervical swelling, continue pitocin titration ? ?All discussed with patient, see orders ? ? ?[redacted]w[redacted]d, CNM ?[facility] ?[facility] ?12/27/2021  1:49 PM ? ?

## 2021-12-27 NOTE — Anesthesia Procedure Notes (Signed)
Epidural ?Patient location during procedure: OB ?Start time: 12/27/2021 9:20 AM ?End time: 12/27/2021 9:32 AM ? ?Staffing ?Anesthesiologist: Piscitello, Cleda Mccreedy, MD ?Resident/CRNA: Jeanine Luz, CRNA ?Performed: resident/CRNA  ? ?Preanesthetic Checklist ?Completed: patient identified, IV checked, site marked, risks and benefits discussed, surgical consent, monitors and equipment checked and pre-op evaluation ? ?Epidural ?Patient position: sitting ?Prep: ChloraPrep ?Patient monitoring: heart rate, continuous pulse ox and blood pressure ?Approach: midline ?Location: L3-L4 ?Injection technique: LOR saline ? ?Needle:  ?Needle type: Tuohy  ?Needle gauge: 17 G ?Needle length: 9 cm and 9 ?Needle insertion depth: 7 cm ?Catheter type: closed end flexible ?Catheter size: 19 Gauge ?Catheter at skin depth: 11 cm ?Test dose: negative and 1.5% lidocaine with Epi 1:200 K ? ?Assessment ?Events: blood not aspirated, injection not painful, no injection resistance, no paresthesia and negative IV test ? ?Additional Notes ?1 attempt ?Pt. Evaluated and documentation done after procedure finished. ?Patient identified. Risks/Benefits/Options discussed with patient including but not limited to bleeding, infection, nerve damage, paralysis, failed block, incomplete pain control, headache, blood pressure changes, nausea, vomiting, reactions to medication both or allergic, itching and postpartum back pain. Confirmed with bedside nurse the patient's most recent platelet count. Confirmed with patient that they are not currently taking any anticoagulation, have any bleeding history or any family history of bleeding disorders. Patient expressed understanding and wished to proceed. All questions were answered. Sterile technique was used throughout the entire procedure. Please see nursing notes for vital signs. Test dose was given through epidural catheter and negative prior to continuing to dose epidural or start infusion. Warning signs of high  block given to the patient including shortness of breath, tingling/numbness in hands, complete motor block, or any concerning symptoms with instructions to call for help. Patient was given instructions on fall risk and not to get out of bed. All questions and concerns addressed with instructions to call with any issues or inadequate analgesia.   ? ?Patient tolerated the insertion well without immediate complications.Reason for block:procedure for pain ? ? ? ?

## 2021-12-28 DIAGNOSIS — Z3A40 40 weeks gestation of pregnancy: Secondary | ICD-10-CM

## 2021-12-28 DIAGNOSIS — O480 Post-term pregnancy: Secondary | ICD-10-CM

## 2021-12-28 DIAGNOSIS — O411230 Chorioamnionitis, third trimester, not applicable or unspecified: Secondary | ICD-10-CM

## 2021-12-28 MED ORDER — METHYLERGONOVINE MALEATE 0.2 MG/ML IJ SOLN
INTRAMUSCULAR | Status: AC
  Filled 2021-12-28: qty 1

## 2021-12-28 MED ORDER — GENTAMICIN SULFATE 40 MG/ML IJ SOLN
5.0000 mg/kg | INTRAVENOUS | Status: DC
  Administered 2021-12-28: 440 mg via INTRAVENOUS
  Filled 2021-12-28: qty 11

## 2021-12-28 MED ORDER — BENZOCAINE-MENTHOL 20-0.5 % EX AERO
1.0000 "application " | INHALATION_SPRAY | CUTANEOUS | Status: DC | PRN
  Administered 2021-12-28 – 2021-12-30 (×2): 1 via TOPICAL
  Filled 2021-12-28 (×4): qty 56

## 2021-12-28 MED ORDER — PRENATAL MULTIVITAMIN CH
1.0000 | ORAL_TABLET | Freq: Every day | ORAL | Status: DC
  Administered 2021-12-28 – 2021-12-30 (×3): 1 via ORAL
  Filled 2021-12-28 (×4): qty 1

## 2021-12-28 MED ORDER — CARBOPROST TROMETHAMINE 250 MCG/ML IM SOLN
INTRAMUSCULAR | Status: AC
  Filled 2021-12-28: qty 1

## 2021-12-28 MED ORDER — TRANEXAMIC ACID-NACL 1000-0.7 MG/100ML-% IV SOLN
INTRAVENOUS | Status: AC
  Filled 2021-12-28: qty 100

## 2021-12-28 MED ORDER — IBUPROFEN 600 MG PO TABS
600.0000 mg | ORAL_TABLET | Freq: Four times a day (QID) | ORAL | Status: DC
  Administered 2021-12-28 – 2021-12-30 (×8): 600 mg via ORAL
  Filled 2021-12-28 (×10): qty 1

## 2021-12-28 MED ORDER — ONDANSETRON HCL 4 MG PO TABS: 4.0000 mg | ORAL_TABLET | ORAL | Status: DC | PRN

## 2021-12-28 MED ORDER — ACETAMINOPHEN 325 MG PO TABS
650.0000 mg | ORAL_TABLET | ORAL | Status: DC | PRN
  Administered 2021-12-30: 650 mg via ORAL
  Filled 2021-12-28 (×2): qty 2

## 2021-12-28 MED ORDER — DIPHENHYDRAMINE HCL 25 MG PO CAPS: 25.0000 mg | ORAL_CAPSULE | Freq: Four times a day (QID) | ORAL | Status: DC | PRN

## 2021-12-28 MED ORDER — ACETAMINOPHEN 325 MG PO TABS
ORAL_TABLET | ORAL | Status: AC
  Administered 2021-12-28: 650 mg via ORAL
  Filled 2021-12-28: qty 2

## 2021-12-28 MED ORDER — WITCH HAZEL-GLYCERIN EX PADS
1.0000 "application " | MEDICATED_PAD | CUTANEOUS | Status: DC | PRN
  Administered 2021-12-28: 1 via TOPICAL
  Filled 2021-12-28: qty 100

## 2021-12-28 MED ORDER — COCONUT OIL OIL
1.0000 "application " | TOPICAL_OIL | Status: DC | PRN
  Administered 2021-12-28: 1 via TOPICAL
  Filled 2021-12-28: qty 120

## 2021-12-28 MED ORDER — SODIUM CHLORIDE 0.9 % IV SOLN
2.0000 g | Freq: Four times a day (QID) | INTRAVENOUS | Status: DC
  Administered 2021-12-28: 2 g via INTRAVENOUS
  Filled 2021-12-28: qty 2000

## 2021-12-28 MED ORDER — DIBUCAINE (PERIANAL) 1 % EX OINT
1.0000 "application " | TOPICAL_OINTMENT | CUTANEOUS | Status: DC | PRN
  Administered 2021-12-28 – 2021-12-30 (×2): 1 via RECTAL
  Filled 2021-12-28 (×3): qty 28

## 2021-12-28 MED ORDER — ONDANSETRON HCL 4 MG/2ML IJ SOLN: 4.0000 mg | INTRAMUSCULAR | Status: DC | PRN

## 2021-12-28 MED ORDER — SENNOSIDES-DOCUSATE SODIUM 8.6-50 MG PO TABS
2.0000 | ORAL_TABLET | Freq: Every day | ORAL | Status: DC
  Administered 2021-12-29 – 2021-12-30 (×2): 2 via ORAL
  Filled 2021-12-28 (×3): qty 2

## 2021-12-28 MED ORDER — TETANUS-DIPHTH-ACELL PERTUSSIS 5-2.5-18.5 LF-MCG/0.5 IM SUSY
0.5000 mL | PREFILLED_SYRINGE | Freq: Once | INTRAMUSCULAR | Status: DC
  Filled 2021-12-28: qty 0.5

## 2021-12-28 MED ORDER — SIMETHICONE 80 MG PO CHEW: 80.0000 mg | CHEWABLE_TABLET | ORAL | Status: DC | PRN

## 2021-12-28 NOTE — Discharge Instructions (Addendum)
Discharge Instructions:  ? ?Follow-up Appointment: Call and schedule an appointment with Tresea Mall, CNM at [facility] for a visit in 6 weeks!  ? ?If there are any new medications, they have been ordered and will be available for pickup at the listed pharmacy on your way home from the hospital.  ? ?Call office if you have any of the following: headache, visual changes, fever >101.0 F, chills, shortness of breath, breast concerns, excessive vaginal bleeding, incision drainage or problems, leg pain or redness, depression or any other concerns. If you have vaginal discharge with an odor, let your doctor know.  ? ?It is normal to bleed for up to 6 weeks. You should not soak through more than 1 pad in 1 hour. If you have a blood clot larger than your fist with continued bleeding, call your doctor.  ? ?Activity: Do not lift > 10 lbs for 6 weeks (do not lift anything heavier than your baby). ?No intercourse, tampons, swimming pools, hot tubs, baths (only showers) for 6 weeks.  ?No driving for 1-2 weeks. ?Continue prenatal vitamin, especially if breastfeeding. ?Increase calories and fluids (water) while breastfeeding.  ? ?Your milk will come in, in the next couple of days (right now it is colostrum). You may have a slight fever when your milk comes in, but it should go away on its own.  If it does not, and rises above 101 F please call the doctor. You will also feel achy and your breasts will be firm. They will also start to leak. If you are breastfeeding, continue as you have been and you can pump/express milk for comfort.  ? ?If you have too much milk, your breasts can become engorged, which could lead to mastitis. This is an infection of the milk ducts. It can be very painful and you will need to notify your doctor to obtain a prescription for antibiotics. You can also treat it with a shower or hot/cold compress.  ? ?For concerns about your baby, please call your pediatrician.  ?For breastfeeding concerns, the  lactation consultant can be reached at [phone].  ? ?Postpartum blues (feelings of happy one minute and sad another minute) are normal for the first few weeks but if it gets worse let your doctor know.  ? ?Congratulations! We enjoyed caring for you and your new bundle of joy!   ?

## 2021-12-28 NOTE — Discharge Summary (Signed)
?OB Discharge Summary  ?   ?Patient Name: Kristina [name] ?DOB: Jul 24, 1996 ?MRN: [id number] ? ?Date of admission: 12/26/2021 ?Delivering MD: Brennan Bailey ?Date of Delivery: 12/28/2021  ?Date of discharge: 12/30/2021 ? ?Admitting diagnosis: Irregular uterine contractions [O47.9] ?Uterine contractions [O47.9] ?Intrauterine pregnancy: [redacted]w[redacted]d     ?Secondary diagnosis: None ?    ?Discharge diagnosis: Term Pregnancy Delivered                                  ?                                                              ?Post partum procedures: none ? ?Augmentation: AROM and Pitocin ? ?Complications: None ? ?Hospital course:  Onset of Labor With Vaginal Delivery      ?26 y.o. yo G1P0 at [redacted]w[redacted]d was admitted in Active Labor on 12/26/2021. Patient had a labor course complicated by chorioamnionitis:  ?Membrane Rupture Time/Date: 10:49 AM ,12/27/2021   ?Delivery Method:Vaginal, Vacuum (Extractor)  ?Episiotomy: Median  ?Lacerations:  2nd degree  ?See delivery note for details ? ?Patient had an uncomplicated postpartum course.  She is ambulating, tolerating a regular diet, passing flatus, and urinating well. Patient is discharged home in stable condition on 12/30/21. ? ?Newborn Data: ?Birth date:12/28/2021  ?Birth time:6:16 AM  ?Gender:Female   Madelyn ?Living status:Living  ?Apgars:3 ,6  ?Weight:3430 g  7 pounds 9 ounces ? ?Physical exam  ?Vitals:  ? 12/29/21 1513 12/29/21 1936 12/29/21 2323 12/30/21 0819  ?BP: 101/68 106/77 108/76 101/74  ?Pulse: 88 86 96 94  ?Resp: 18 20 20    ?Temp: 98 ?F (36.7 ?C) 98.1 ?F (36.7 ?C) 98.2 ?F (36.8 ?C) 97.9 ?F (36.6 ?C)  ?TempSrc: Oral Oral Oral Oral  ?SpO2: (!) 80% 100% 99% 100%  ?Weight:      ?Height:      ? ?General: alert, cooperative, and no distress ?Lochia: appropriate ?Uterine Fundus: firm ?Incision: N/A ?DVT Evaluation: No evidence of DVT seen on physical exam. ?Negative Homan's sign. ?No cords or calf tenderness. ?No significant calf/ankle edema. ? ?Labs: ?Lab Results  ?Component  Value Date  ? WBC 19.0 (H) 12/29/2021  ? HGB 10.0 (L) 12/29/2021  ? HCT 30.1 (L) 12/29/2021  ? MCV 95.6 12/29/2021  ? PLT 282 12/29/2021  ? ? ?Discharge instruction: per After Visit Summary. ? ?Medications:  ?Allergies as of 12/30/2021   ?No Known Allergies ?  ? ?  ?Medication List  ?  ? ?TAKE these medications   ? ?ferrous sulfate 325 (65 FE) MG tablet ?Take 1 tablet (325 mg total) by mouth daily with breakfast. ?  ?norethindrone 0.35 MG tablet ?Commonly known as: Ortho Micronor ?Take 1 tablet (0.35 mg total) by mouth daily. ?Start taking on: Jan 26, 2022 ?  ?prenatal multivitamin Tabs tablet ?Take 1 tablet by mouth daily at 12 noon. ?  ? ?  ? ? ?Diet: routine diet ? ?Activity: Advance as tolerated. Pelvic rest for 6 weeks.  ? ?Outpatient follow up: ? Follow-up Information   ? ? Jan 28, 2022, CNM. Schedule an appointment as soon as possible for a visit in 6 week(s).   ?Specialty: Obstetrics ?Why: postpartum follow up visit ?Contact information: ?[address] ?[place]  [address] ?[phone] ? ? ?  ?  ? ?  ?  ? ?  ?   ? ?Postpartum contraception: Progesterone only pills ?Rhogam Given postpartum: Rh positive ?Rubella vaccine given postpartum: immune ?Varicella vaccine given postpartum: immune ?TDaP given antepartum or postpartum: given antepartum ? ? ?Newborn Delivery   ?Birth date/time: 12/28/2021 06:16:00 ?Delivery type: Vaginal, Vacuum (Extractor) ?  ?  ? ? ?Baby Feeding: Breast ? ?Disposition:NICU ? ?SIGNED: ? ?Doreene Burke, CNM ?12/30/2021 10:22 AM   ?

## 2021-12-28 NOTE — Lactation Note (Signed)
This note was copied from a baby's chart. ?Lactation Consultation Note ? ?Patient Name: Kristina [name] ?Today's Date: 12/28/2021 ?Reason for consult: Follow-up assessment;Primapara;NICU baby ?Age:27 hours ? ?Maternal Data ? Mom to SCN to attempt a feeding ? ?Feeding ?Mother's Current Feeding Choice: Breast Milk ?BAby rooting and showing  cues, 24 mm nipple shield utilized at breast on left side in cradle position, baby able to suck a few times but can't coordinate suck , pulls off and cries, did this also in football hold on right, very fussy at breast, feeding attempts ended and baby placed skin to skin on mom's chest, mom to pump when she returns to her room ? ?LATCH Score ?Latch: Repeated attempts needed to sustain latch, nipple held in mouth throughout feeding, stimulation needed to elicit sucking reflex. ? ?Audible Swallowing: None ? ?Type of Nipple: Flat ? ?Comfort (Breast/Nipple): Filling, red/small blisters or bruises, mild/mod discomfort (firm breasts) ? ?Hold (Positioning): Full assist, staff holds infant at breast ? ?LATCH Score: 3 ? ? ?Lactation Tools Discussed/Used ?Tools: Nipple Dorris Carnes ?Nipple shield size: 24 ? ?Interventions ?Interventions: Breast feeding basics reviewed;Assisted with latch;Skin to skin;Hand express;Adjust position;Support pillows;Education ? ?Discharge ?Pump: Employee Pump [employer] pump given) ?Insurance info obtained and placed in med records basket on 3rd floor   ?Consult Status ?Consult Status: Follow-up ?Date: 12/29/21 ?Follow-up type: In-patient ? ? ? ?Dyann Kief ?12/28/2021, 7:03 PM ? ? ? ?

## 2021-12-28 NOTE — Progress Notes (Signed)
Verbal consent given to Dr Logan Bores for a vacuum assisted delivery.  ?Vacuum applied at 0545 ?Pulled at 0547 for 40 seconds. ?Pulled at 0550 for 40 seconds. ?Pulled at 0553 for 40 seconds. ?Pulled at 0556 for 40 seconds. ?Pulled at 0559 for 47 seconds. ?Pulled at 0603 for 50 seconds. ?Pulled at 0607 for 56 seconds. ?Pulled at 0609 for 50 seconds. ?Pulled at 0612 for 50 seconds. ?Pulled at 0615 for 20 seconds. ? ?Vacuum removed at 0616. Successful vacuum delivery with no pop offs. Baby girl delivered at [phone].  ?

## 2021-12-28 NOTE — Lactation Note (Signed)
This note was copied from a baby's chart. ?Lactation Consultation Note ? ?Patient Name: Girl Yeraldin Adler Alton ?Today's Date: 12/28/2021 ?Reason for consult: Initial assessment;Primapara;NICU baby;Term ?Age:27 hours ? ?Maternal Data ?Has patient been taught Hand Expression?: Yes ?Does the patient have breastfeeding experience prior to this delivery?: No ?Noted that mom has flat, inverted nipples that evert with pumping   ?Feeding ?Mother's Current Feeding Choice: Breast Milk ? ?LATCH Score ?  ? ?  ? ?  ? ?  ? ?  ? ?  ? ? ?Lactation Tools Discussed/Used ?Tools: Pump ?Breast pump type: Double-Electric Breast Pump ?Pump Education: Setup, frequency, and cleaning;Milk Storage ?Reason for Pumping: baby in SCN ?Pumping frequency: q3h ?Pumped volume: 0 mL ?No drops expressed with pumping, 1 drop obtained with hand expression , mom states she has been leaking from breasts since around 20 wks, breasts are swollen ?Interventions ?Interventions: Breast feeding basics reviewed;Hand express;DEBP;Education ?LC name and no written on white board ?Discharge ?Pump: Employee Pump ?WIC Program: No ? ?Consult Status ?Consult Status: PRN ? ? ? ?Dyann Kief ?12/28/2021, 12:00 PM ? ? ? ?

## 2021-12-29 LAB — CBC
HCT: 30.1 % — ABNORMAL LOW (ref 36.0–46.0)
Hemoglobin: 10 g/dL — ABNORMAL LOW (ref 12.0–15.0)
MCH: 31.7 pg (ref 26.0–34.0)
MCHC: 33.2 g/dL (ref 30.0–36.0)
MCV: 95.6 fL (ref 80.0–100.0)
Platelets: 282 10*3/uL (ref 150–400)
RBC: 3.15 MIL/uL — ABNORMAL LOW (ref 3.87–5.11)
RDW: 13.8 % (ref 11.5–15.5)
WBC: 19 10*3/uL — ABNORMAL HIGH (ref 4.0–10.5)
nRBC: 0 % (ref 0.0–0.2)

## 2021-12-29 MED ORDER — FERROUS SULFATE 325 (65 FE) MG PO TABS
325.0000 mg | ORAL_TABLET | Freq: Every day | ORAL | Status: DC
  Administered 2021-12-29 – 2021-12-30 (×2): 325 mg via ORAL
  Filled 2021-12-29 (×2): qty 1

## 2021-12-29 MED ORDER — NORETHINDRONE 0.35 MG PO TABS
1.0000 | ORAL_TABLET | Freq: Every day | ORAL | 11 refills | Status: DC
  Filled 2021-12-29: qty 28, 28d supply, fill #0

## 2021-12-29 MED ORDER — FERROUS SULFATE 325 (65 FE) MG PO TABS
325.0000 mg | ORAL_TABLET | Freq: Every day | ORAL | 1 refills | Status: DC
  Filled 2021-12-29: qty 30, 30d supply, fill #0

## 2021-12-29 NOTE — TOC Progression Note (Signed)
Transition of Care (TOC) - Progression Note  ? ? ?Patient Details  ?Name: Keasha Malkiewicz ?MRN: [id number] ?Date of Birth: 1996-08-06 ? ?Transition of Care (TOC) CM/SW Contact  ?Dannetta Lekas L Travaughn Vue, LCSWA ?Phone Number: ?12/29/2021, 10:03 AM ? ?Clinical Narrative:    ? ?TOC consult for parental support. CSW spoke to patient and FOB at bedside. Patient reported having support and having no current needs. CSW offered mental health therapy resources and patient declined. ? ?No TOC needs at this time. ? ? ?  ?  ? ?Expected Discharge Plan and Services ?  ?  ?  ?  ?  ?                ?  ?  ?  ?  ?  ?  ?  ?  ?  ?  ? ? ?Social Determinants of Health (SDOH) Interventions ?  ? ?Readmission Risk Interventions ?   ? View : No data to display.  ?  ?  ?  ? ? ?

## 2021-12-29 NOTE — Progress Notes (Signed)
Post Partum Day 1 ?Subjective: ?Feeling well, no complaints, up ad lib, voiding, tolerating PO, + flatus, and pumping for baby. Has been to see baby in SCN. Plans to shower this AM. ? ?Objective: ?Blood pressure 100/60, pulse 91, temperature 97.6 ?F (36.4 ?C), temperature source Oral, resp. rate 20, height 5\' 3"  (1.6 m), weight 88.9 kg, SpO2 95 %. ? ?Physical Exam:  ?General: alert, cooperative, and appears stated age ?Lochia: appropriate ?Uterine Fundus: firm ?Laceration: healing well ?DVT Evaluation: No evidence of DVT seen on physical exam. ? ?Recent Labs  ?  12/27/21 ?0404 12/29/21 ?12/31/21  ?HGB 12.4 10.0*  ?HCT 37.0 30.1*  ? ? ?Assessment/Plan: ?Plan for discharge tomorrow ? ? LOS: 2 days  ? ?4854 ?12/29/2021, 7:39 AM  ? ? ?

## 2021-12-30 NOTE — Final Progress Note (Signed)
Final Progress Note ? ?Patient ID: ?Kristina [name] ?MRN: [id number] ?DOB/AGE: [birth year] 26 y.o. ? ?Admit date: 12/26/2021 ?Admitting provider: Harlin Heys, MD ?Discharge date: 12/30/2021 ? ? ?Admission Diagnoses: Labor ? ?Discharge Diagnoses:  ?Principal Problem: ?  Uterine contractions ?Active Problems: ?  Postpartum care following vaginal delivery ?  Encounter for care or examination of lactating mother ?  [redacted] weeks gestation of pregnancy ?  ? ?Consults: None ? ?Procedures: AVD ? ?Discharge Condition: good ? ?Disposition: Discharge disposition: 01-Home or Self Care ? ? ? ? ? ? ?Diet: Regular diet ? ?Discharge Activity: Activity as tolerated ? ? ?Allergies as of 12/30/2021   ?No Known Allergies ?  ? ?  ?Medication List  ?  ? ?TAKE these medications   ? ?ferrous sulfate 325 (65 FE) MG tablet ?Take 1 tablet (325 mg total) by mouth daily with breakfast. ?  ?norethindrone 0.35 MG tablet ?Commonly known as: Ortho Micronor ?Take 1 tablet (0.35 mg total) by mouth daily. ?Start taking on: Jan 26, 2022 ?  ?prenatal multivitamin Tabs tablet ?Take 1 tablet by mouth daily at 12 noon. ?  ? ?  ? ? Follow-up Information   ? ? Rod Can, CNM. Schedule an appointment as soon as possible for a visit in 6 week(s).   ?Specialty: Obstetrics ?Why: postpartum follow up visit ?Contact information: ?[address] ?[place] [address] ?[phone] ? ? ?  ?  ? ?  ?  ? ?  ? ? ?Total time spent taking care of this patient: 10 minutes ? ?Signed: ?Philip Aspen ?12/30/2021, 10:33 AM ?

## 2021-12-30 NOTE — Anesthesia Postprocedure Evaluation (Signed)
Anesthesia Post Note ? ?Patient: Kristina [name] ? ?Procedure(s) Performed: AN AD HOC LABOR EPIDURAL ? ?Patient location during evaluation: Mother Baby ?Anesthesia Type: Epidural ?Level of consciousness: awake and alert ?Pain management: pain level controlled ?Vital Signs Assessment: post-procedure vital signs reviewed and stable ?Respiratory status: spontaneous breathing, nonlabored ventilation and respiratory function stable ?Cardiovascular status: blood pressure returned to baseline and stable ?Postop Assessment: no apparent nausea or vomiting, able to ambulate, no headache and no backache ?Anesthetic complications: no ? ? ?No notable events documented. ? ? ?Last Vitals:  ?Vitals:  ? 12/29/21 2323 12/30/21 0819  ?BP: 108/76 101/74  ?Pulse: 96 94  ?Resp: 20   ?Temp: 36.8 ?C 36.6 ?C  ?SpO2: 99% 100%  ?  ?Last Pain:  ?Vitals:  ? 12/30/21 0819  ?TempSrc: Oral  ?PainSc:   ? ? ?  ?  ?  ?  ?  ?  ? ?Foye Deer ? ? ? ? ?

## 2021-12-30 NOTE — Progress Notes (Signed)
Discharge instructions and Education completed with Pt who v/o. Pt. Is staying with her Infant who is "Rooming In " in room 350. Mom v/o of calling to make her 6 week F/U appointment in A.M. ?

## 2021-12-31 ENCOUNTER — Other Ambulatory Visit: Payer: Self-pay

## 2022-01-01 ENCOUNTER — Other Ambulatory Visit: Payer: Self-pay

## 2022-01-08 ENCOUNTER — Ambulatory Visit: Payer: Self-pay | Admitting: Family Medicine

## 2022-01-18 ENCOUNTER — Encounter: Payer: Self-pay | Admitting: Internal Medicine

## 2022-01-18 ENCOUNTER — Ambulatory Visit (INDEPENDENT_AMBULATORY_CARE_PROVIDER_SITE_OTHER): Payer: No Typology Code available for payment source | Admitting: Internal Medicine

## 2022-01-18 VITALS — BP 104/80 | HR 93 | Ht 63.0 in | Wt 161.0 lb

## 2022-01-18 DIAGNOSIS — R142 Eructation: Secondary | ICD-10-CM

## 2022-01-18 NOTE — Progress Notes (Signed)
? ? ?Date:  01/18/2022  ? ?Name:  Kristina [name]   DOB:  28-Oct-1995   MRN:  [id number] ? ? ?Chief Complaint: Establish Care ? ?Gastroesophageal Reflux ?She complains of belching. She reports no chest pain, no dysphagia, no heartburn, no sore throat or no wheezing. This is a recurrent problem. The problem occurs frequently. The problem has been unchanged. Exacerbated by: drinking soda. Pertinent negatives include no anemia, fatigue, orthopnea or weight loss. She has tried nothing for the symptoms.  ? ?No results found for: NA, K, CO2, GLUCOSE, BUN, CREATININE, CALCIUM, EGFR, GFRNONAA, GLUCOSE ?No results found for: CHOL, HDL, LDLCALC, LDLDIRECT, TRIG, CHOLHDL ?No results found for: TSH ?Lab Results  ?Component Value Date  ? HGBA1C 5.7 (H) 06/04/2021  ? ?Lab Results  ?Component Value Date  ? WBC 19.0 (H) 12/29/2021  ? HGB 10.0 (L) 12/29/2021  ? HCT 30.1 (L) 12/29/2021  ? MCV 95.6 12/29/2021  ? PLT 282 12/29/2021  ? ?No results found for: ALT, AST, GGT, ALKPHOS, BILITOT ?No results found for: 25OHVITD2, [place], VD25OH  ? ?Review of Systems  ?Constitutional:  Negative for chills, fatigue, fever, unexpected weight change and weight loss.  ?HENT:  Negative for sore throat.   ?Respiratory:  Negative for chest tightness, shortness of breath and wheezing.   ?Cardiovascular:  Negative for chest pain and leg swelling.  ?Gastrointestinal:  Negative for abdominal distention, constipation, diarrhea, dysphagia and heartburn.  ?     Belching  ?Neurological:  Negative for dizziness and headaches.  ?Psychiatric/Behavioral:  Negative for dysphoric mood and sleep disturbance. The patient is not nervous/anxious.   ? ?There are no problems to display for this patient. ? ? ?No Known Allergies ? ?Past Surgical History:  ?Procedure Laterality Date  ? WISDOM TOOTH EXTRACTION    ? ? ?Social History  ? ?Tobacco Use  ? Smoking status: Never  ? Smokeless tobacco: Never  ?Vaping Use  ? Vaping Use: Never used  ?Substance Use Topics  ?  Alcohol use: Never  ? Drug use: Never  ? ? ? ?Medication list has been reviewed and updated. ? ?Current Meds  ?Medication Sig  ? ferrous sulfate 325 (65 FE) MG tablet Take 1 tablet (325 mg total) by mouth daily with breakfast.  ? Prenatal Vit-Fe Fumarate-FA (PRENATAL MULTIVITAMIN) TABS tablet Take 1 tablet by mouth daily at 12 noon.  ? ? ? ?  01/18/2022  ?  3:00 PM 07/04/2020  ?  8:21 AM  ?GAD 7 : Generalized Anxiety Score  ?Nervous, Anxious, on Edge 0 0  ?Control/stop worrying 0 0  ?Worry too much - different things 0 0  ?Trouble relaxing 0 0  ?Restless 0 0  ?Easily annoyed or irritable 0 1  ?Afraid - awful might happen 0 0  ?Total GAD 7 Score 0 1  ?Anxiety Difficulty Not difficult at all   ? ? ? ?  01/18/2022  ?  3:00 PM  ?Depression screen PHQ 2/9  ?Decreased Interest 0  ?Down, Depressed, Hopeless 0  ?PHQ - 2 Score 0  ?Altered sleeping 0  ?Tired, decreased energy 0  ?Change in appetite 0  ?Feeling bad or failure about yourself  0  ?Trouble concentrating 0  ?Moving slowly or fidgety/restless 0  ?Suicidal thoughts 0  ?PHQ-9 Score 0  ?Difficult doing work/chores Not difficult at all  ? ? ?BP Readings from Last 3 Encounters:  ?01/18/22 104/80  ?12/30/21 108/68  ?12/26/21 120/80  ? ? ?Physical Exam ?Vitals and nursing note reviewed.  ?Constitutional:   ?  General: She is not in acute distress. ?   Appearance: Normal appearance. She is well-developed.  ?HENT:  ?   Head: Normocephalic and atraumatic.  ?Neck:  ?   Vascular: No carotid bruit.  ?Cardiovascular:  ?   Rate and Rhythm: Normal rate and regular rhythm.  ?   Pulses: Normal pulses.  ?   Heart sounds: No murmur heard. ?Pulmonary:  ?   Effort: Pulmonary effort is normal. No respiratory distress.  ?   Breath sounds: No wheezing or rhonchi.  ?Abdominal:  ?   General: Abdomen is flat.  ?   Palpations: Abdomen is soft. There is no mass.  ?   Tenderness: There is no abdominal tenderness.  ?Musculoskeletal:  ?   Cervical back: Normal range of motion.  ?   Right lower leg:  No edema.  ?   Left lower leg: No edema.  ?Lymphadenopathy:  ?   Cervical: No cervical adenopathy.  ?Skin: ?   General: Skin is warm and dry.  ?   Findings: No rash.  ?Neurological:  ?   General: No focal deficit present.  ?   Mental Status: She is alert and oriented to person, place, and time.  ?Psychiatric:     ?   Mood and Affect: Mood normal.     ?   Behavior: Behavior normal.  ? ? ?Wt Readings from Last 3 Encounters:  ?01/18/22 161 lb (73 kg)  ?12/26/21 196 lb (88.9 kg)  ?12/26/21 196 lb (88.9 kg)  ? ? ?BP 104/80   Pulse 93   Ht 5' 3"  (1.6 m)   Wt 161 lb (73 kg)   SpO2 99%   Breastfeeding Yes Comment: both breast feeding and formula  BMI 28.52 kg/m?  ? ?Assessment and Plan: ?1. Belching ?Related primarily to consuming soda. ?I suspect drinking straws also contribute to symptoms ?No symptoms of dyspepsia, gerd, etc ?Follow up if worsening. ? ?Return for CPX with labs. ? ?Partially dictated using Editor, commissioning. Any errors are unintentional. ? ?Halina Maidens, MD ?[facility] ?[facility] ? ?01/18/2022 ? ? ? ? ?

## 2022-01-31 ENCOUNTER — Other Ambulatory Visit: Payer: Self-pay

## 2022-01-31 ENCOUNTER — Encounter: Payer: Self-pay | Admitting: Family Medicine

## 2022-01-31 ENCOUNTER — Ambulatory Visit (INDEPENDENT_AMBULATORY_CARE_PROVIDER_SITE_OTHER): Payer: No Typology Code available for payment source | Admitting: Family Medicine

## 2022-01-31 VITALS — BP 118/78 | HR 110 | Ht 63.0 in | Wt 161.0 lb

## 2022-01-31 DIAGNOSIS — J029 Acute pharyngitis, unspecified: Secondary | ICD-10-CM | POA: Diagnosis not present

## 2022-01-31 DIAGNOSIS — J020 Streptococcal pharyngitis: Secondary | ICD-10-CM

## 2022-01-31 LAB — POCT RAPID STREP A (OFFICE): Rapid Strep A Screen: POSITIVE — AB

## 2022-01-31 MED ORDER — AMOXICILLIN 500 MG PO CAPS
1000.0000 mg | ORAL_CAPSULE | Freq: Every day | ORAL | 0 refills | Status: AC
  Filled 2022-01-31: qty 20, 10d supply, fill #0

## 2022-01-31 NOTE — Patient Instructions (Signed)
-   Take antibiotics for full course - Alternate between acetaminophen (Tylenol) and ibuprofen (Advil) for pain control - Use salt water gargles throughout the day - Contact our office for any lingering symptoms after antibiotics complete - Otherwise follow-up as-needed

## 2022-01-31 NOTE — Assessment & Plan Note (Signed)
Patient with 1 day history of severe progressive throat pain, began last night, reports subjective fevers, whole body chills, denies any sick contacts.  Has comorbid mild congestion in the setting of longstanding allergies, did note transient bilateral ear pain, denies any coughing, no shortness of air, no chest pain, no abdominal/GI symptoms, no urinary symptoms.  Examination reveals mildly ill-appearing individual, oropharynx with mild erythema, swelling, exudate noted, nasopharynx with mild erythema but otherwise nonswollen, bilateral tympanic membranes and canals benign, no discrete lymphadenopathy noted but she has tenderness along the left anterior cervical chain, clear and benign cardiopulmonary findings.  Rapid stress test positive, given constellation of symptoms we will treat for strep pharyngitis with 10-day course of amoxicillin.  She is a new mother with a 67-month-old at home, she is not nursing/pumping.  Advised the patient to follow-up if symptoms persist after antibiotic course.  Additionally, supportive care measures reviewed.

## 2022-01-31 NOTE — Progress Notes (Signed)
     Primary Care / Sports Medicine Office Visit  Patient Information:  Patient ID: Kevionna Heffler, female DOB: 30-Nov-1995 Age: 26 y.o. MRN: [id number]   Tyshawna Alarid Felipa Furnace is a pleasant 26 y.o. female presenting with the following:  Chief Complaint  Patient presents with   Sore Throat    Started last night, unknown fever, headache, body aches. Pt has taken 2 dose of left over Amox    Vitals:   01/31/22 0959  BP: 118/78  Pulse: (!) 110  SpO2: 98%   Vitals:   01/31/22 0959  Weight: 161 lb (73 kg)  Height: 5\' 3"  (1.6 m)   Body mass index is 28.52 kg/m.  No results found.   Independent interpretation of notes and tests performed by another provider:   None  Procedures performed:   None  Pertinent History, Exam, Impression, and Recommendations:   Problem List Items Addressed This Visit       Respiratory   Strep pharyngitis - Primary    Patient with 1 day history of severe progressive throat pain, began last night, reports subjective fevers, whole body chills, denies any sick contacts.  Has comorbid mild congestion in the setting of longstanding allergies, did note transient bilateral ear pain, denies any coughing, no shortness of air, no chest pain, no abdominal/GI symptoms, no urinary symptoms.  Examination reveals mildly ill-appearing individual, oropharynx with mild erythema, swelling, exudate noted, nasopharynx with mild erythema but otherwise nonswollen, bilateral tympanic membranes and canals benign, no discrete lymphadenopathy noted but she has tenderness along the left anterior cervical chain, clear and benign cardiopulmonary findings.  Rapid stress test positive, given constellation of symptoms we will treat for strep pharyngitis with 10-day course of amoxicillin.  She is a new mother with a 103-month-old at home, she is not nursing/pumping.  Advised the patient to follow-up if symptoms persist after antibiotic course.  Additionally, supportive care  measures reviewed.       Relevant Medications   amoxicillin (AMOXIL) 500 MG tablet   Other Visit Diagnoses     Sore throat       Relevant Orders   POCT rapid strep A (Completed)        Orders & Medications Meds ordered this encounter  Medications   amoxicillin (AMOXIL) 500 MG tablet    Sig: Take 2 tablets (1,000 mg total) by mouth daily for 10 days.    Dispense:  20 tablet    Refill:  0   Orders Placed This Encounter  Procedures   POCT rapid strep A     Return if symptoms worsen or fail to improve.     3-month, MD   Primary Care Sports Medicine [facility]

## 2022-02-11 ENCOUNTER — Ambulatory Visit: Payer: No Typology Code available for payment source | Admitting: Advanced Practice Midwife

## 2022-02-27 ENCOUNTER — Other Ambulatory Visit: Payer: Self-pay

## 2022-02-27 ENCOUNTER — Other Ambulatory Visit (HOSPITAL_COMMUNITY)
Admission: RE | Admit: 2022-02-27 | Discharge: 2022-02-27 | Disposition: A | Discharge status: Home / Self Care | Payer: No Typology Code available for payment source | Source: Ambulatory Visit | Attending: Advanced Practice Midwife | Admitting: Advanced Practice Midwife

## 2022-02-27 ENCOUNTER — Encounter: Payer: Self-pay | Admitting: Advanced Practice Midwife

## 2022-02-27 ENCOUNTER — Ambulatory Visit (INDEPENDENT_AMBULATORY_CARE_PROVIDER_SITE_OTHER): Payer: No Typology Code available for payment source | Admitting: Advanced Practice Midwife

## 2022-02-27 DIAGNOSIS — N898 Other specified noninflammatory disorders of vagina: Secondary | ICD-10-CM | POA: Insufficient documentation

## 2022-02-27 DIAGNOSIS — Z30011 Encounter for initial prescription of contraceptive pills: Secondary | ICD-10-CM

## 2022-02-27 MED ORDER — NORGESTIM-ETH ESTRAD TRIPHASIC 0.18/0.215/0.25 MG-25 MCG PO TABS
1.0000 | ORAL_TABLET | Freq: Every day | ORAL | 3 refills | Status: DC
  Filled 2022-02-27: qty 84, 84d supply, fill #0
  Filled 2022-05-21: qty 84, 84d supply, fill #1
  Filled 2022-08-31: qty 84, 84d supply, fill #2
  Filled 2022-11-15: qty 84, 84d supply, fill #3

## 2022-02-27 NOTE — Progress Notes (Signed)
Postpartum Visit  Chief Complaint:  Chief Complaint  Patient presents with   Postpartum Care    History of Present Illness: Patient is a 26 y.o. G1P0 presents for postpartum visit. She mentions some vaginal itching after recent period.  Review the Delivery Report for details.   Date of delivery: 12/28/2021 Type of delivery: Vaginal delivery - Vacuum or forceps assisted:  yes/vacuum managed by Dr Logan Bores Episiotomy No.  Laceration: yes  Pregnancy or labor problems:  prolonged active/2nd stage with chorioamnionitis Any problems since the delivery:  no  Newborn Details:  SINGLETON :  1. BabyGender female. Birth Weight: 7 pounds 9 ounces, 3430 g Maternal Details:  Breast or formula feeding: Breast initially and now formula feeding Intercourse: No  Contraception after delivery:  OCP Any bowel or bladder issues: No  Post partum depression/anxiety noted:  no Edinburgh Post-Partum Depression Score: 0 Date of last PAP: 06/04/2021  no abnormalities   Review of Systems: Review of Systems  Constitutional:  Negative for chills and fever.  HENT:  Negative for congestion, ear discharge, ear pain, hearing loss, sinus pain and sore throat.   Eyes:  Negative for blurred vision and double vision.  Respiratory:  Negative for cough, shortness of breath and wheezing.   Cardiovascular:  Negative for chest pain, palpitations and leg swelling.  Gastrointestinal:  Negative for abdominal pain, blood in stool, constipation, diarrhea, heartburn, melena, nausea and vomiting.  Genitourinary:  Negative for dysuria, flank pain, frequency, hematuria and urgency.  Musculoskeletal:  Negative for back pain, joint pain and myalgias.  Skin:  Negative for itching and rash.  Neurological:  Negative for dizziness, tingling, tremors, sensory change, speech change, focal weakness, seizures, loss of consciousness, weakness and headaches.  Endo/Heme/Allergies:  Negative for environmental allergies. Does not  bruise/bleed easily.  Psychiatric/Behavioral:  Negative for depression, hallucinations, memory loss, substance abuse and suicidal ideas. The patient is not nervous/anxious and does not have insomnia.     Past Medical History:  Past Medical History:  Diagnosis Date   No pertinent past medical history     Past Surgical History:  Past Surgical History:  Procedure Laterality Date   WISDOM TOOTH EXTRACTION      Family History:  Family History  Problem Relation Age of Onset   Hypertension Mother     Social History:  Social History   Socioeconomic History   Marital status: Married    Spouse name: Ozzy   Number of children: 1   Years of education: Not on file   Highest education level: Not on file  Occupational History    Employer: [employer]  Tobacco Use   Smoking status: Never   Smokeless tobacco: Never  Vaping Use   Vaping Use: Never used  Substance and Sexual Activity   Alcohol use: Never   Drug use: Never   Sexual activity: Yes    Partners: Male    Birth control/protection: Pill  Other Topics Concern   Not on file  Social History Narrative   Not on file   Social Determinants of Health   Financial Resource Strain: Low Risk  (01/18/2022)   Overall Financial Resource Strain (CARDIA)    Difficulty of Paying Living Expenses: Not hard at all  Food Insecurity: No Food Insecurity (01/18/2022)   Hunger Vital Sign    Worried About Running Out of Food in the Last Year: Never true    Ran Out of Food in the Last Year: Never true  Transportation Needs: No Transportation Needs (  01/18/2022)   PRAPARE - Administrator, Civil Service (Medical): No    Lack of Transportation (Non-Medical): No  Physical Activity: Not on file  Stress: Not on file  Social Connections: Not on file  Intimate Partner Violence: Not At Risk (01/18/2022)   Humiliation, Afraid, Rape, and Kick questionnaire    Fear of Current or Ex-Partner: No    Emotionally Abused: No    Physically Abused:  No    Sexually Abused: No    Allergies:  No Known Allergies  Medications: Prior to Admission medications   Medication Sig Start Date End Date Taking? Authorizing Provider  ferrous sulfate 325 (65 FE) MG tablet Take 1 tablet (325 mg total) by mouth daily with breakfast. 12/29/21  Yes Swanson, Adan Sis, CNM  norethindrone (ORTHO MICRONOR) 0.35 MG tablet Take 1 tablet (0.35 mg total) by mouth daily. Patient not taking: Reported on 01/18/2022 01/26/22 01/26/23  Glenetta Borg, CNM  Prenatal Vit-Fe Fumarate-FA (PRENATAL MULTIVITAMIN) TABS tablet Take 1 tablet by mouth daily at 12 noon. Patient not taking: Reported on 02/27/2022    [provider]    Physical Exam Blood pressure 120/80, height 5\' 3"  (1.6 m), weight 162 lb (73.5 kg), last menstrual period 02/13/2022, not currently breastfeeding.    General: NAD HEENT: normocephalic, anicteric Pulmonary: No increased work of breathing Abdomen: NABS, soft, non-tender, non-distended.  Umbilicus without lesions.  No hepatomegaly, splenomegaly or masses palpable. No evidence of hernia. Genitourinary:  External: Normal external female genitalia.  Normal urethral meatus, normal  Bartholin's and Skene's glands.    Vagina: Normal vaginal mucosa, no evidence of prolapse.    Cervix: no CMT  Uterus: Non-enlarged, mobile, normal contour.    Adnexa: ovaries non-enlarged, no adnexal masses  Rectal: deferred Extremities: no edema, erythema, or tenderness Neurologic: Grossly intact Psychiatric: mood appropriate, affect full   Edinburgh Postnatal Depression Scale - 02/27/22 0924       Edinburgh Postnatal Depression Scale:  In the Past 7 Days   I have been able to laugh and see the funny side of things. 0    I have looked forward with enjoyment to things. 0    I have blamed myself unnecessarily when things went wrong. 0    I have been anxious or worried for no good reason. 0    I have felt scared or panicky for no good reason. 0    Things  have been getting on top of me. 0    I have been so unhappy that I have had difficulty sleeping. 0    I have felt sad or miserable. 0    I have been so unhappy that I have been crying. 0    The thought of harming myself has occurred to me. 0    Edinburgh Postnatal Depression Scale Total 0             Assessment: 26 y.o. G1P0 presenting for 6 week postpartum visit  Plan: Problem List Items Addressed This Visit   None Visit Diagnoses     6 weeks postpartum follow-up    -  Primary   Vagina itching       Relevant Orders   Cervicovaginal ancillary only   Vaginal discharge       Relevant Orders   Cervicovaginal ancillary only        1) Contraception - Education given regarding options for contraception, as well as compatibility with breast feeding if applicable.  Patient plans on OCP (estrogen/progesterone) for  contraception.  2)  Pap -ASCCP guidelines and rationale discussed.  Patient opts for every 3 years screening interval  3) Patient underwent screening for postpartum depression with no signs of depression  4) Return in about 1 year (around 02/28/2023) for annual established gyn.   Tresea Mall, CNM [facility] 02/27/2022, 12:43 PM

## 2022-02-28 ENCOUNTER — Other Ambulatory Visit: Payer: Self-pay

## 2022-02-28 LAB — CERVICOVAGINAL ANCILLARY ONLY
Bacterial Vaginitis (gardnerella): POSITIVE — AB
Candida Glabrata: NEGATIVE
Candida Vaginitis: NEGATIVE
Comment: NEGATIVE
Comment: NEGATIVE
Comment: NEGATIVE

## 2022-03-01 ENCOUNTER — Other Ambulatory Visit: Payer: Self-pay

## 2022-03-01 ENCOUNTER — Other Ambulatory Visit: Payer: Self-pay | Admitting: Advanced Practice Midwife

## 2022-03-01 DIAGNOSIS — B3731 Acute candidiasis of vulva and vagina: Secondary | ICD-10-CM

## 2022-03-01 DIAGNOSIS — B9689 Other specified bacterial agents as the cause of diseases classified elsewhere: Secondary | ICD-10-CM

## 2022-03-01 MED ORDER — FLUCONAZOLE 150 MG PO TABS
150.0000 mg | ORAL_TABLET | Freq: Once | ORAL | 1 refills | Status: AC
  Filled 2022-03-01: qty 1, 1d supply, fill #0

## 2022-03-01 MED ORDER — METRONIDAZOLE 500 MG PO TABS
500.0000 mg | ORAL_TABLET | Freq: Two times a day (BID) | ORAL | 0 refills | Status: AC
  Filled 2022-03-01: qty 14, 7d supply, fill #0

## 2022-04-17 ENCOUNTER — Encounter: Payer: Self-pay | Admitting: Internal Medicine

## 2022-05-06 ENCOUNTER — Encounter: Payer: Self-pay | Admitting: Internal Medicine

## 2022-05-06 ENCOUNTER — Ambulatory Visit (INDEPENDENT_AMBULATORY_CARE_PROVIDER_SITE_OTHER): Payer: No Typology Code available for payment source | Admitting: Obstetrics & Gynecology

## 2022-05-06 ENCOUNTER — Encounter: Payer: Self-pay | Admitting: Obstetrics & Gynecology

## 2022-05-06 VITALS — BP 90/62 | Ht 63.0 in | Wt 165.0 lb

## 2022-05-06 DIAGNOSIS — Z01419 Encounter for gynecological examination (general) (routine) without abnormal findings: Secondary | ICD-10-CM | POA: Diagnosis not present

## 2022-05-06 NOTE — Progress Notes (Signed)
Subjective:    Kristina [name] is a 26 y.o. married P83 (68 month old daughter) who presents for an annual exam. The patient has no complaints today. The patient is sexually active. GYN screening history: last pap: was normal. She denies a h/o abnormal paps. The patient wears seatbelts: yes. The patient participates in regular exercise: no. Has the patient ever been transfused or tattooed?: no. The patient reports that there is not domestic violence in her life.   Menstrual History: OB History     Gravida  1   Para  1   Term  1   Preterm      AB      Living  1      SAB      IAB      Ectopic      Multiple      Live Births  1           Menarche age: 68 Patient's last menstrual period was 04/10/2022 (exact date). Period Duration (Days): 5-6 Period Pattern: Regular Menstrual Flow: Moderate, Light Menstrual Control: Thin pad, Maxi pad Menstrual Control Change Freq (Hours): 2-3 Dysmenorrhea: (!) Moderate Dysmenorrhea Symptoms: Cramping  The following portions of the patient's history were reviewed and updated as appropriate: allergies, current medications, past family history, past medical history, past social history, past surgical history, and problem list.  Review of Systems Pertinent items are noted in HPI.  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 She thinks that she had Gardasil but will check and let the office know if she needs to get it. She denies a family history of breast, gyn, and colon cancer.   Objective:    BP 90/62   Ht 5\' 3"  (1.6 m)   Wt 165 lb (74.8 kg)   LMP 04/10/2022 (Exact Date)   BMI 29.23 kg/m   General Appearance:    Alert, cooperative, no distress, appears stated age  Head:    Normocephalic, without obvious abnormality, atraumatic  Eyes:    PERRL, conjunctiva/corneas clear, EOM's intact, fundi    benign, both eyes  Ears:    Normal TM's and external ear canals, both ears  Nose:   Nares normal, septum midline, mucosa  normal, no drainage    or sinus tenderness  Throat:   Lips, mucosa, and tongue normal; teeth and gums normal  Neck:   Supple, symmetrical, trachea midline, no adenopathy;    thyroid:  no enlargement/tenderness/nodules; no carotid   bruit or JVD  Back:     Symmetric, no curvature, ROM normal, no CVA tenderness  Lungs:     Clear to auscultation bilaterally, respirations unlabored  Chest Wall:    No tenderness or deformity   Heart:    Regular rate and rhythm, S1 and S2 normal, no murmur, rub   or gallop  Breast Exam:    No tenderness, masses, or nipple abnormality  Abdomen:     Soft, non-tender, bowel sounds active all four quadrants,    no masses, no organomegaly  Genitalia:    Normal female without lesion, discharge or tenderness, normal size and shape, anteverted, mobile, non-tender, normal adnexal exam      Extremities:   Extremities normal, atraumatic, no cyanosis or edema  Pulses:   2+ and symmetric all extremities  Skin:   Skin color, texture, turgor normal, no rashes or lesions  Lymph nodes:   Cervical, supraclavicular, and axillary nodes normal  Neurologic:   CNII-XII intact, normal strength, sensation and reflexes  throughout  .    Assessment:    Healthy female exam.    Plan:     I have encouraged healthy habits and self breast exams. She will call when she needs a refill of OCPs. She does not feel that I should send out GC/CT testing.

## 2022-05-21 ENCOUNTER — Other Ambulatory Visit: Payer: Self-pay

## 2022-07-24 ENCOUNTER — Encounter: Payer: No Typology Code available for payment source | Admitting: Internal Medicine

## 2022-07-24 IMAGING — US US OB COMP +14 WK
1 series · 15 of 28 positions shown · non-contrast
Comparison: none

CLINICAL DATA: Pregnancy.  Assess fetal anatomy

EXAM:
OBSTETRICAL ULTRASOUND >14 WKS

[Series 1: us ob comp + 14 wk · 15 of 95 slices shown]
[im 1/95]
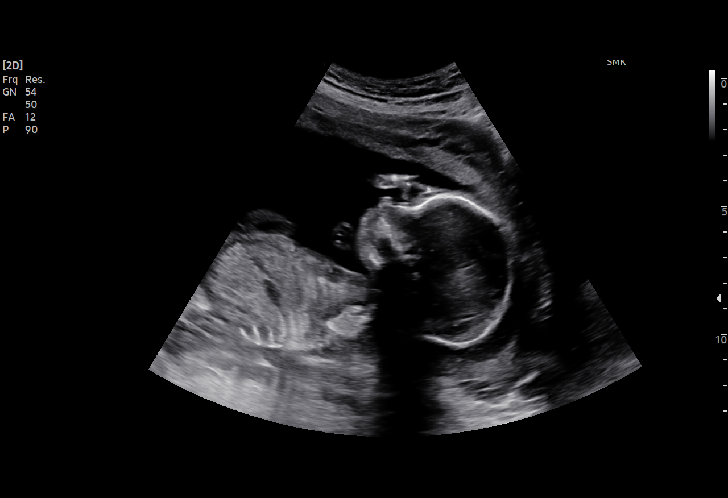
[im 7/95]
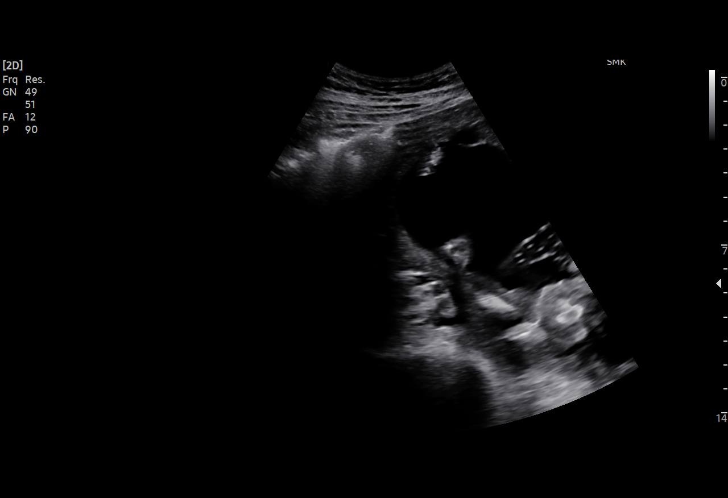
[im 14/95]
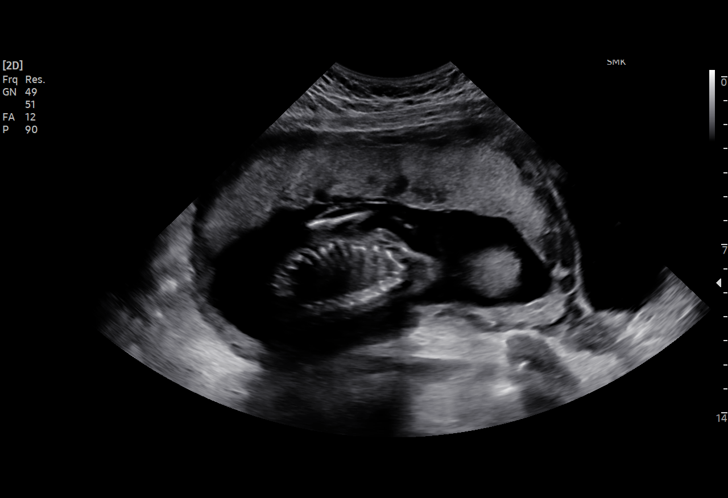
[im 21/95]
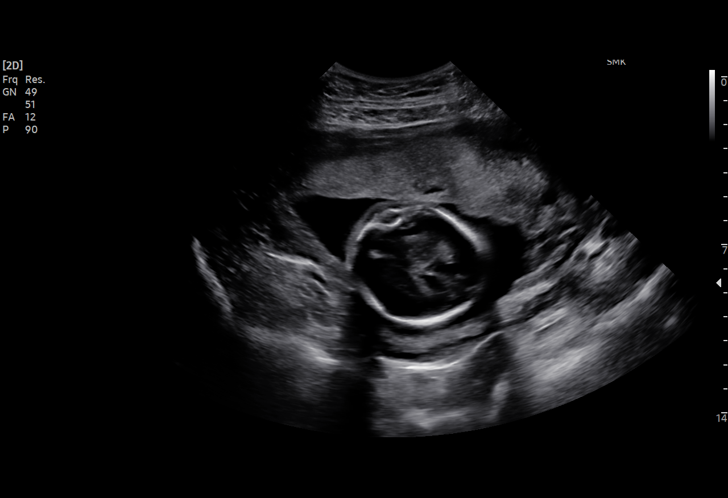
[im 28/95]
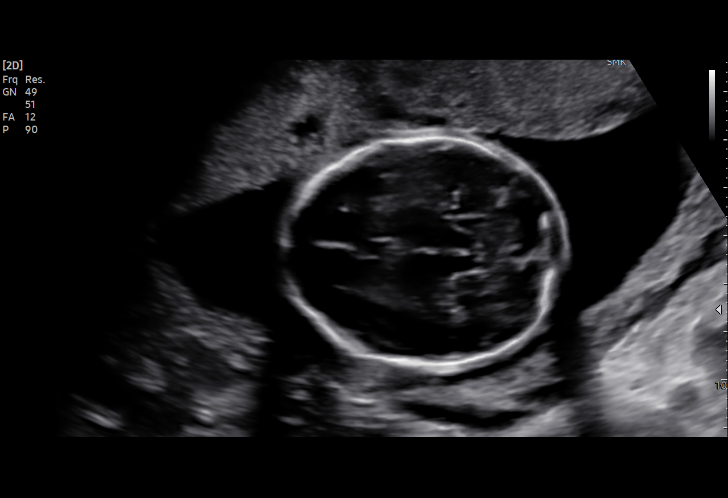
[im 35/95]
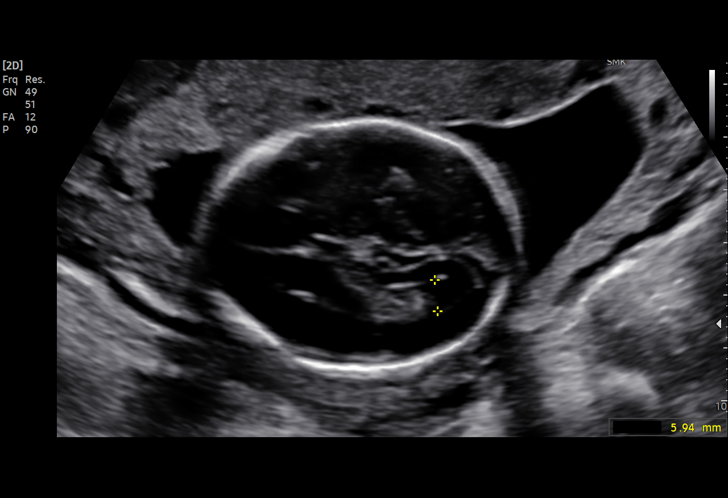
[im 42/95]
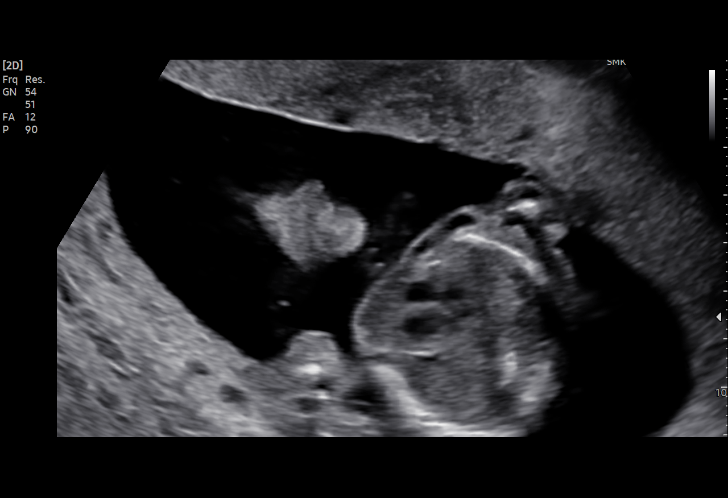
[im 49/95]
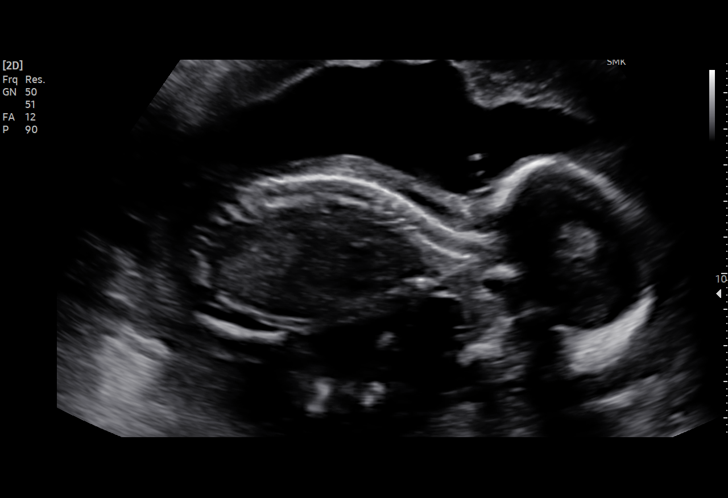
[im 53/95]
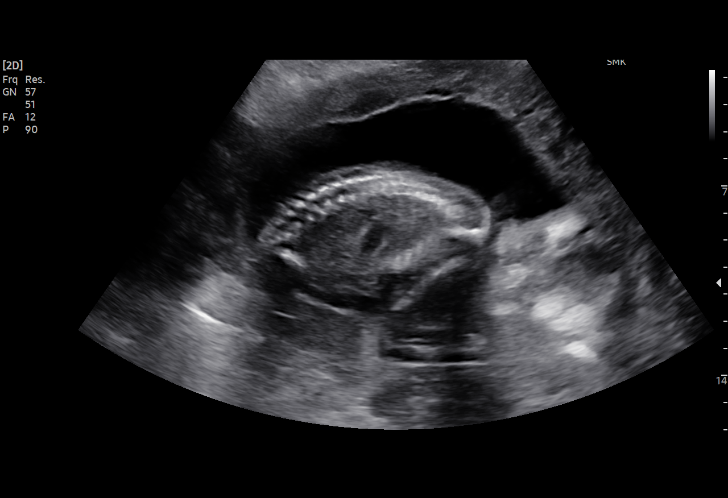
[im 60/95]
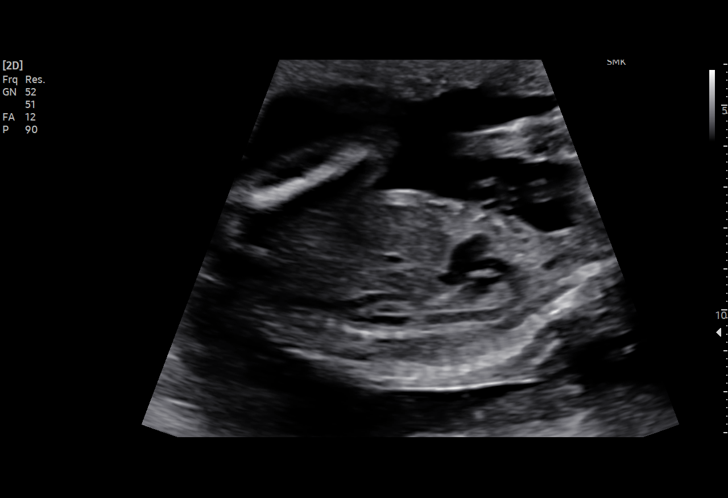
[im 67/95]
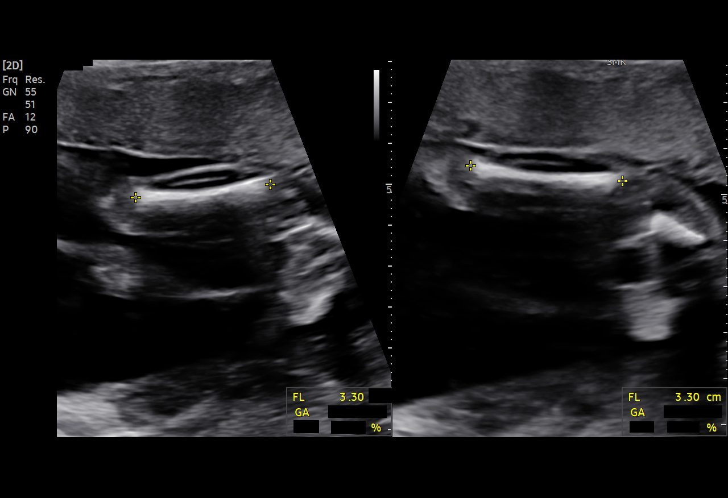
[im 74/95]
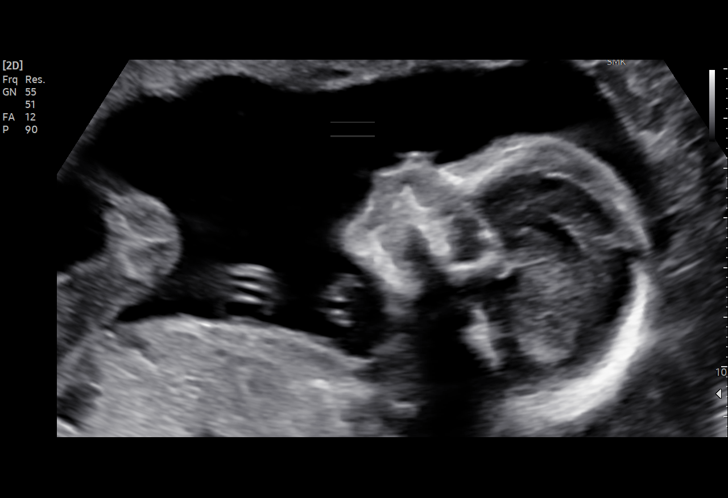
[im 81/95]
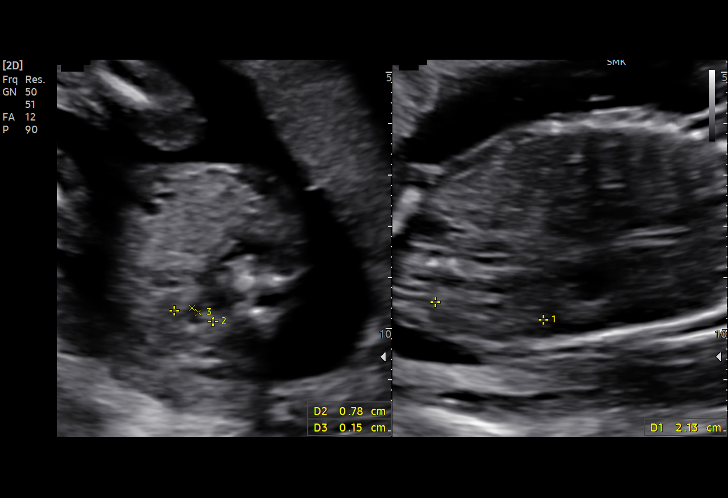
[im 88/95]
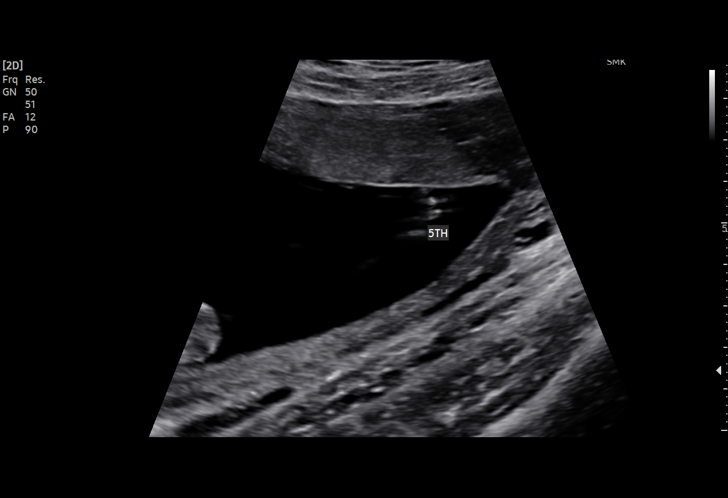
[im 95/95]
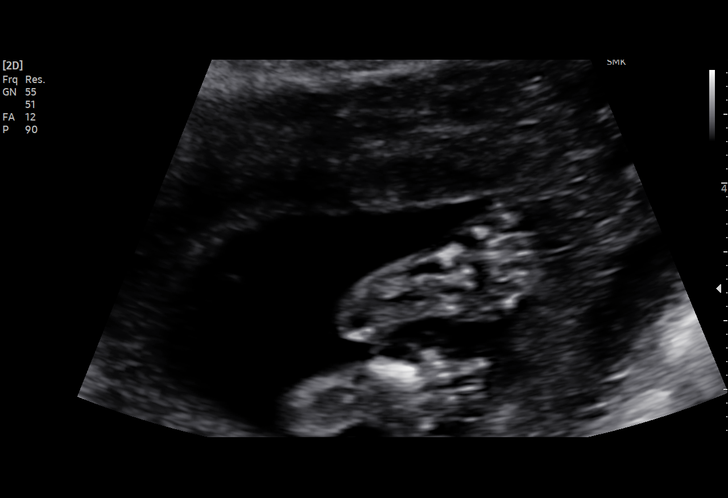

[15 of 28 positions shown; findings below may reference images not displayed]

FINDINGS: Number of Fetuses: 1

Heart Rate:  147 bpm

Movement: Yes

Presentation: Cephalic

Previa: No

Placental Location: Anterior

Amniotic Fluid (Subjective): Normal

Amniotic Fluid (Objective):

Vertical pocket = 6.3cm

FETAL BIOMETRY

BPD: 4.7cm 20w 2d

HC:   17.8cm 20w 2d

AC:   14.8cm 20w 1d

FL:   3.3cm 20w 2d

Current Mean GA: 20w 2d US EDC: 12/25/2021

Assigned GA:  20w 2d Assigned EDC: 12/25/2021

Estimated Fetal Weight:  339g

FETAL ANATOMY

Lateral Ventricles: Appears normal

Thalami/CSP: Appears normal

Posterior Fossa:  Appears normal

Nuchal Region: Appears normal   NFT= 3 mm

Upper Lip: Appears normal

Spine: Appears normal

4 Chamber Heart on Left: Appears normal

LVOT: Appears normal

RVOT: Appears normal

Stomach on Left: Appears normal

3 Vessel Cord: Appears normal

Cord Insertion site: Appears normal

Kidneys: Appears normal

Bladder: Appears normal

Extremities: Appears normal

Technically difficult due to: Fetal positioning

Maternal Findings:

Cervix:  Closed.  3.5 cm.
IMPRESSION: 1. Single live intrauterine gestation in cephalic presentation.
2. Mean gestational age of 20 weeks 2 days.
3. Complete fetal anatomic survey.  No fetal anomalies detected.

## 2022-09-02 ENCOUNTER — Other Ambulatory Visit: Payer: Self-pay

## 2022-09-03 ENCOUNTER — Other Ambulatory Visit: Payer: Self-pay

## 2022-09-05 ENCOUNTER — Ambulatory Visit (INDEPENDENT_AMBULATORY_CARE_PROVIDER_SITE_OTHER): Payer: No Typology Code available for payment source | Admitting: Family Medicine

## 2022-09-05 ENCOUNTER — Telehealth: Payer: Self-pay | Admitting: Internal Medicine

## 2022-09-05 ENCOUNTER — Encounter: Payer: Self-pay | Admitting: Family Medicine

## 2022-09-05 VITALS — BP 120/80 | HR 78 | Ht 63.0 in | Wt 164.0 lb

## 2022-09-05 DIAGNOSIS — J029 Acute pharyngitis, unspecified: Secondary | ICD-10-CM

## 2022-09-05 DIAGNOSIS — J020 Streptococcal pharyngitis: Secondary | ICD-10-CM | POA: Diagnosis not present

## 2022-09-05 DIAGNOSIS — R52 Pain, unspecified: Secondary | ICD-10-CM

## 2022-09-05 LAB — POCT INFLUENZA A/B
Influenza A, POC: NEGATIVE
Influenza B, POC: NEGATIVE

## 2022-09-05 LAB — POCT RAPID STREP A (OFFICE): Rapid Strep A Screen: POSITIVE — AB

## 2022-09-05 MED ORDER — PENICILLIN V POTASSIUM 500 MG PO TABS: 500.0000 mg | ORAL_TABLET | Freq: Three times a day (TID) | ORAL | 0 refills | Status: AC

## 2022-09-05 NOTE — Progress Notes (Signed)
     Primary Care / Sports Medicine Office Visit  Patient Information:  Patient ID: Kristina [name], female DOB: 1995-09-10 Age: 26 y.o. MRN: [id number]   Kristina [name] is a pleasant 26 y.o. female presenting with the following:  Chief Complaint  Patient presents with   Nasal Congestion    Ear pain, sore throat, nasal congestion. Thick mucus coming down back of throat. Fever 101    Vitals:   09/05/22 1404  BP: 120/80  Pulse: 78  SpO2: 98%   Vitals:   09/05/22 1404  Weight: 164 lb (74.4 kg)  Height: 5\' 3"  (1.6 m)   Body mass index is 29.05 kg/m.  No results found.   Independent interpretation of notes and tests performed by another provider:   None  Procedures performed:   None  Pertinent History, Exam, Impression, and Recommendations:   Problem List Items Addressed This Visit       Respiratory   Strep pharyngitis - Primary    Symptom onset 09/02/2022 involving sore throat, congestion, secondary cough, fever Tmax 101, onset of ear pain today.  Examination with white exudates right oropharynx, injected appearance, right cervical lymphadenopathy, tympanic membranes and canals benign, turbinates benign, lung fields clear to auscultation bilaterally, benign heart sounds.  Point-of-care flu and strep performed, positive strep noted.  Plan for penicillin 500 mg 3 times daily x 10 days, supportive care, advised checking with her 29-month-old's pediatrician regarding her new diagnosis.      Relevant Medications   penicillin v potassium (VEETID) 500 MG tablet   Other Visit Diagnoses     Sore throat       Relevant Orders   POCT rapid strep A (Completed)   Body aches       Relevant Orders   POCT Influenza A/B (Completed)        Orders & Medications Meds ordered this encounter  Medications   penicillin v potassium (VEETID) 500 MG tablet    Sig: Take 1 tablet (500 mg total) by mouth 3 (three) times daily for 10 days.    Dispense:  30 tablet     Refill:  0   Orders Placed This Encounter  Procedures   POCT rapid strep A   POCT Influenza A/B     Return if symptoms worsen or fail to improve.     6-month, MD, [facility]   Primary Care Sports Medicine Primary Care and Sports Medicine at [facility]

## 2022-09-05 NOTE — Assessment & Plan Note (Signed)
Symptom onset 09/02/2022 involving sore throat, congestion, secondary cough, fever Tmax 101, onset of ear pain today.  Examination with white exudates right oropharynx, injected appearance, right cervical lymphadenopathy, tympanic membranes and canals benign, turbinates benign, lung fields clear to auscultation bilaterally, benign heart sounds.  Point-of-care flu and strep performed, positive strep noted.  Plan for penicillin 500 mg 3 times daily x 10 days, supportive care, advised checking with her 15-month-old's pediatrician regarding her new diagnosis.

## 2022-09-05 NOTE — Telephone Encounter (Signed)
Copied from CRM [id number]. Topic: Appointment Scheduling - Scheduling Inquiry for Clinic >> Sep 05, 2022  9:05 AM Kristina [name] wrote: Reason for CRM: The patient would like to be contacted by a member of administrative staff when possible   The patient would like to discuss scheduling and PCP assignment   Please contact the patient further when possible

## 2022-09-05 NOTE — Patient Instructions (Addendum)
-   Take antibiotics for full course - Remain out of work until you have been on antibiotics for 24 hours - Check in with your child's pediatrician regarding your diagnosis of strep today - Review information regarding supportive / symptom control - Follow-up as-needed

## 2022-09-06 ENCOUNTER — Other Ambulatory Visit: Payer: Self-pay

## 2022-09-06 ENCOUNTER — Other Ambulatory Visit: Payer: Self-pay | Admitting: Family Medicine

## 2022-09-06 ENCOUNTER — Encounter: Payer: Self-pay | Admitting: Family Medicine

## 2022-09-06 MED ORDER — IBUPROFEN 800 MG PO TABS
800.0000 mg | ORAL_TABLET | Freq: Three times a day (TID) | ORAL | 0 refills | Status: DC | PRN
  Filled 2022-09-06: qty 30, 10d supply, fill #0

## 2022-09-06 NOTE — Telephone Encounter (Signed)
Please review.  KP

## 2022-09-18 ENCOUNTER — Encounter: Payer: Self-pay | Admitting: Internal Medicine

## 2022-09-18 NOTE — Telephone Encounter (Signed)
Can you please reschedule pts CPE?

## 2022-09-18 NOTE — Telephone Encounter (Signed)
Done

## 2022-11-15 ENCOUNTER — Other Ambulatory Visit: Payer: Self-pay

## 2023-01-02 ENCOUNTER — Ambulatory Visit (INDEPENDENT_AMBULATORY_CARE_PROVIDER_SITE_OTHER): Payer: 59 | Admitting: Internal Medicine

## 2023-01-02 ENCOUNTER — Encounter: Payer: Self-pay | Admitting: Internal Medicine

## 2023-01-02 VITALS — BP 124/78 | HR 67 | Ht 63.0 in | Wt 167.0 lb

## 2023-01-02 DIAGNOSIS — Z131 Encounter for screening for diabetes mellitus: Secondary | ICD-10-CM

## 2023-01-02 DIAGNOSIS — D649 Anemia, unspecified: Secondary | ICD-10-CM

## 2023-01-02 DIAGNOSIS — Z13220 Encounter for screening for lipoid disorders: Secondary | ICD-10-CM

## 2023-01-02 DIAGNOSIS — Z0000 Encounter for general adult medical examination without abnormal findings: Secondary | ICD-10-CM | POA: Diagnosis not present

## 2023-01-02 DIAGNOSIS — J3089 Other allergic rhinitis: Secondary | ICD-10-CM | POA: Diagnosis not present

## 2023-01-02 NOTE — Progress Notes (Signed)
Date:  01/02/2023   Name:  Kristina Kristina [name]   DOB:  [birth year]   MRN:  [id number]   Chief Complaint: Annual Exam Kristina Kristina [name] is a 27 y.o. female who presents today for her Complete Annual Exam. She feels well. She reports exercising- none. She reports she is sleeping well. Breast complaints - none.  Menses are regular on OCPs.  Kristina [name] gyn concerns.  Pap smear: 05/2021 neg thin prep   Health Maintenance Due  Topic Date Due   HPV VACCINES (3 - 3-dose series) 03/10/2014   COVID-19 Vaccine (3 - 2023-24 season) 05/10/2022    Immunization History  Administered Date(s) Administered   Dtap, Unspecified 10/22/1995, 12/17/1995, 02/12/1996, 03/16/1997, 01/08/2001   HIB (PRP-OMP) 10/22/1995, 12/17/1995, 09/16/1996, 02/11/1997   HPV Bivalent 09/10/2013, 11/10/2013   Hepatitis A 03/22/2007, 07/10/2012   IPV 10/22/1995, 12/17/1995, 02/12/1996, 01/08/2001   Influenza-Unspecified 07/10/2012, 09/05/2017   MMR 09/16/1996, 01/15/2001   Meningococcal Conjugate 04/09/2007, 07/10/2012   PFIZER(Purple Top)SARS-COV-2 Vaccination 07/21/2020, 08/11/2020   PPD Test 09/15/2020   Tdap 04/09/2007, 09/15/2020, 10/17/2021   Varicella 11/05/2012    HPI  Kristina [name] results found for: "NA", "K", "CO2", "GLUCOSE", "BUN", "CREATININE", "CALCIUM", "EGFR", "GFRNONAA" Kristina [name] results found for: "CHOL", "HDL", "LDLCALC", "LDLDIRECT", "TRIG", "CHOLHDL" Kristina [name] results found for: "TSH" Lab Results  Component Value Date   HGBA1C 5.7 (H) 06/04/2021   Lab Results  Component Value Date   WBC 19.0 (H) 12/29/2021   HGB 10.0 (L) 12/29/2021   HCT 30.1 (L) 12/29/2021   MCV 95.6 12/29/2021   PLT 282 12/29/2021   Kristina [name] results found for: "ALT", "AST", "GGT", "ALKPHOS", "BILITOT" Kristina [name] results found for: "25OHVITD2", "25OHVITD3", "VD25OH"   Review of Systems  Constitutional:  Negative for chills, fatigue and fever.  HENT:  Positive for sneezing. Negative for congestion, hearing loss, tinnitus, trouble swallowing and voice  change.   Eyes:  Positive for itching. Negative for visual disturbance.  Respiratory:  Negative for cough, chest tightness, shortness of breath and wheezing.   Cardiovascular:  Negative for chest pain, palpitations and leg swelling.  Gastrointestinal:  Negative for abdominal pain, constipation, diarrhea and vomiting.  Endocrine: Negative for polydipsia and polyuria.  Genitourinary:  Negative for dysuria, frequency, genital sores, vaginal bleeding and vaginal discharge.  Musculoskeletal:  Negative for arthralgias, gait problem and joint swelling.  Skin:  Negative for color change and rash.  Allergic/Immunologic: Positive for environmental allergies.  Neurological:  Negative for dizziness, tremors, light-headedness and headaches.  Hematological:  Negative for adenopathy. Does not bruise/bleed easily.  Psychiatric/Behavioral:  Negative for dysphoric mood and sleep disturbance. The patient is not nervous/anxious.     Patient Active Problem List   Diagnosis Date Noted   Environmental and seasonal allergies 01/02/2023    Kristina [name] Known Allergies  Past Surgical History:  Procedure Laterality Date   WISDOM TOOTH EXTRACTION      Social History   Tobacco Use   Smoking status: Never   Smokeless tobacco: Never  Vaping Use   Vaping Use: Never used  Substance Use Topics   Alcohol use: Not Currently   Drug use: Never     Medication list has been reviewed and updated.  Current Meds  Medication Sig   Norgestimate-Ethinyl Estradiol Triphasic (TRI-LO-SPRINTEC) 0.18/0.215/0.25 MG-25 MCG tab Take 1 tablet by mouth daily.       01/02/2023    8:31 AM 01/18/2022    3:00 PM 07/04/2020    8:21 AM  GAD 7 : Generalized Anxiety Score  Nervous, Anxious, on  Edge 0 0 0  Control/stop worrying 0 0 0  Worry too much - different things 0 0 0  Trouble relaxing 0 0 0  Restless 0 0 0  Easily annoyed or irritable 0 0 1  Afraid - awful might happen 0 0 0  Total GAD 7 Score 0 0 1  Anxiety Difficulty Not  difficult at all Not difficult at all        01/02/2023    8:31 AM 01/18/2022    3:00 PM 07/04/2020    8:21 AM  Depression screen PHQ 2/9  Decreased Interest 0 0 0  Down, Depressed, Hopeless 0 0 0  PHQ - 2 Score 0 0 0  Altered sleeping 0 0 1  Tired, decreased energy 0 0 1  Change in appetite 0 0 0  Feeling bad or failure about yourself  0 0 0  Trouble concentrating 0 0 0  Moving slowly or fidgety/restless 0 0 0  Suicidal thoughts 0 0 0  PHQ-9 Score 0 0 2  Difficult doing work/chores Not difficult at all Not difficult at all     BP Readings from Last 3 Encounters:  01/02/23 124/78  09/05/22 120/80  05/06/22 90/62    Physical Exam Vitals and nursing note reviewed.  Constitutional:      General: She is not in acute distress.    Appearance: She is well-developed.  HENT:     Head: Normocephalic and atraumatic.     Right Ear: Tympanic membrane and ear canal normal.     Left Ear: Tympanic membrane and ear canal normal.     Nose:     Right Sinus: Kristina [name] maxillary sinus tenderness.     Left Sinus: Kristina [name] maxillary sinus tenderness.  Eyes:     General: Kristina [name] scleral icterus.       Right eye: Kristina [name] discharge.        Left eye: Kristina [name] discharge.     Conjunctiva/sclera: Conjunctivae normal.  Neck:     Thyroid: Kristina [name] thyromegaly.     Vascular: Kristina [name] carotid bruit.  Cardiovascular:     Rate and Rhythm: Normal rate and regular rhythm.     Pulses: Normal pulses.     Heart sounds: Normal heart sounds.  Pulmonary:     Effort: Pulmonary effort is normal. Kristina [name] respiratory distress.     Breath sounds: Kristina [name] wheezing.  Chest:  Breasts:    Right: Kristina [name] mass, nipple discharge, skin change or tenderness.     Left: Kristina [name] mass, nipple discharge, skin change or tenderness.  Abdominal:     General: Bowel sounds are normal.     Palpations: Abdomen is soft.     Tenderness: There is Kristina [name] abdominal tenderness.  Musculoskeletal:     Cervical back: Normal range of motion. Kristina [name] erythema.     Right lower leg: Kristina [name] edema.     Left  lower leg: Kristina [name] edema.  Lymphadenopathy:     Cervical: Kristina [name] cervical adenopathy.  Skin:    General: Skin is warm and dry.     Findings: Kristina [name] rash.  Neurological:     Mental Status: She is alert and oriented to person, place, and time.     Cranial Nerves: Kristina [name] cranial nerve deficit.     Sensory: Kristina [name] sensory deficit.     Deep Tendon Reflexes: Reflexes are normal and symmetric.  Psychiatric:        Attention and Perception: Attention normal.        Mood and Affect: Mood normal.  Wt Readings from Last 3 Encounters:  01/02/23 167 lb (75.8 kg)  09/05/22 164 lb (74.4 kg)  05/06/22 165 lb (74.8 kg)    BP 124/78   Pulse 67   Ht  (1.6 m)   Wt 167 lb (75.8 kg)   LMP 12/25/2022 (Exact Date)   SpO2 97%   BMI 29.58 kg/m   Assessment and Plan:  Problem List Items Addressed This Visit       Other   Environmental and seasonal allergies    Continue Zyrtec or similar during the day Add Benadryl 25 mg at bedtime to aid with symptoms and sleep      Other Visit Diagnoses     Annual physical exam    -  Primary   Normal exam Pap up to date with GYN   Relevant Orders   CBC with Differential/Platelet   Comprehensive metabolic panel   Hemoglobin A1c   Lipid panel   Iron, TIBC and Ferritin Panel   TSH   Screening for diabetes mellitus       Relevant Orders   Comprehensive metabolic panel   Hemoglobin A1c   Screening for lipid disorders       Relevant Orders   Lipid panel   Anemia, unspecified type       Relevant Orders   CBC with Differential/Platelet   Iron, TIBC and Ferritin Panel       Kristina [name] follow-ups on file.   Partially dictated using Dragon software, any errors are not intentional.  Reubin Milan, MD [facility] Primary Care and Sports Medicine [place], [place]

## 2023-01-02 NOTE — Assessment & Plan Note (Signed)
Continue Zyrtec or similar during the day Add Benadryl 25 mg at bedtime to aid with symptoms and sleep

## 2023-01-03 LAB — COMPREHENSIVE METABOLIC PANEL
ALT: 26 IU/L (ref 0–32)
AST: 21 IU/L (ref 0–40)
Albumin/Globulin Ratio: 1.4 (ref 1.2–2.2)
Albumin: 4.3 g/dL (ref 4.0–5.0)
Alkaline Phosphatase: 111 IU/L (ref 44–121)
BUN/Creatinine Ratio: 14 (ref 9–23)
BUN: 8 mg/dL (ref 6–20)
Bilirubin Total: 0.2 mg/dL (ref 0.0–1.2)
CO2: 21 mmol/L (ref 20–29)
Calcium: 9.4 mg/dL (ref 8.7–10.2)
Chloride: 101 mmol/L (ref 96–106)
Creatinine, Ser: 0.59 mg/dL (ref 0.57–1.00)
Globulin, Total: 3 g/dL (ref 1.5–4.5)
Glucose: 86 mg/dL (ref 70–99)
Potassium: 4.5 mmol/L (ref 3.5–5.2)
Sodium: 138 mmol/L (ref 134–144)
Total Protein: 7.3 g/dL (ref 6.0–8.5)
eGFR: 127 mL/min/{1.73_m2} (ref >59)

## 2023-01-03 LAB — LIPID PANEL
Chol/HDL Ratio: 3.5 ratio (ref 0.0–4.4)
Cholesterol, Total: 163 mg/dL (ref 100–199)
HDL: 46 mg/dL (ref >39)
LDL Chol Calc (NIH): 97 mg/dL (ref 0–99)
Triglycerides: 113 mg/dL (ref 0–149)
VLDL Cholesterol Cal: 20 mg/dL (ref 5–40)

## 2023-01-03 LAB — CBC WITH DIFFERENTIAL/PLATELET
Basophils Absolute: 0.1 10*3/uL (ref 0.0–0.2)
Basos: 1 %
EOS (ABSOLUTE): 0.1 10*3/uL (ref 0.0–0.4)
Eos: 3 %
Hematocrit: 42 % (ref 34.0–46.6)
Hemoglobin: 13.4 g/dL (ref 11.1–15.9)
Immature Grans (Abs): 0 10*3/uL (ref 0.0–0.1)
Immature Granulocytes: 0 %
Lymphocytes Absolute: 1.5 10*3/uL (ref 0.7–3.1)
Lymphs: 29 %
MCH: 27.6 pg (ref 26.6–33.0)
MCHC: 31.9 g/dL (ref 31.5–35.7)
MCV: 86 fL (ref 79–97)
Monocytes Absolute: 0.4 10*3/uL (ref 0.1–0.9)
Monocytes: 8 %
Neutrophils Absolute: 3.1 10*3/uL (ref 1.4–7.0)
Neutrophils: 59 %
Platelets: 375 10*3/uL (ref 150–450)
RBC: 4.86 x10E6/uL (ref 3.77–5.28)
RDW: 13.2 % (ref 11.7–15.4)
WBC: 5.2 10*3/uL (ref 3.4–10.8)

## 2023-01-03 LAB — IRON,TIBC AND FERRITIN PANEL
Ferritin: 30 ng/mL (ref 15–150)
Iron Saturation: 19 % (ref 15–55)
Iron: 73 ug/dL (ref 27–159)
Total Iron Binding Capacity: 386 ug/dL (ref 250–450)
UIBC: 313 ug/dL (ref 131–425)

## 2023-01-03 LAB — TSH: TSH: 1.42 u[IU]/mL (ref 0.450–4.500)

## 2023-01-03 LAB — HEMOGLOBIN A1C
Est. average glucose Bld gHb Est-mCnc: 111 mg/dL
Hgb A1c MFr Bld: 5.5 % (ref 4.8–5.6)

## 2023-01-07 ENCOUNTER — Other Ambulatory Visit: Payer: Self-pay

## 2023-01-07 ENCOUNTER — Ambulatory Visit (INDEPENDENT_AMBULATORY_CARE_PROVIDER_SITE_OTHER): Payer: 59 | Admitting: Physician Assistant

## 2023-01-07 ENCOUNTER — Encounter: Payer: Self-pay | Admitting: Physician Assistant

## 2023-01-07 VITALS — BP 114/72 | HR 93 | Temp 97.6°F | Resp 14 | Ht 63.0 in | Wt 167.6 lb

## 2023-01-07 DIAGNOSIS — J3089 Other allergic rhinitis: Secondary | ICD-10-CM

## 2023-01-07 MED ORDER — LEVOCETIRIZINE DIHYDROCHLORIDE 5 MG PO TABS
5.0000 mg | ORAL_TABLET | Freq: Every evening | ORAL | 2 refills | Status: DC
  Filled 2023-01-07: qty 30, 30d supply, fill #0
  Filled 2023-01-29: qty 30, 30d supply, fill #1
  Filled 2023-03-03: qty 30, 30d supply, fill #2

## 2023-01-07 NOTE — Patient Instructions (Addendum)
  I have sent in a script for Levocetirizine (Xyzal) for you to take daily to help with your allergies.  Please stop using the Zyrtec and start the Levocetirizine now. If you wish you can continue to use the Benadryl at night as needed.   It can take a few days to several weeks for these medications to reach full therapeutic effects so remain consistent and patient while taking them  For your eyes you can use an antihistamine eye drop such as Ketotifen or olopatadine to help with the itching and watery eyes. You can try using Azelastine nasal spray to help manage your congestion and runny nose.   I also recommend nasal saline sprays and a humidifier at night to help reduce nasal irritation. Make sure you are washing your clothing, bed sheets, towels and wiping down surfaces in your home to prevent further exposure to allergens such as pollen or pet dander.

## 2023-01-07 NOTE — Assessment & Plan Note (Addendum)
Acute, recurrent concern Patient reports limited relief with current use of Zyrtec and Flonase Symptoms appear consistent with allergic rhinitis She reports previous relief with levocetirizine so we will send in a prescription for this to be taken once per day by mouth Recommend stopping Zyrtec use and using Xyzal instead Also recommend using azelastine nasal spray, ketotifen or olopatadine eyedrops for remaining symptom relief. Also recommend using nasal saline sprays, humidifier, reduction of exposures to allergens through cleaning and wearing a mask outside Reviewed timing for therapeutic effects recommend patient remains consistent with dosing while awaiting medications to kick in If symptoms or not resolving with above measures may need to add montelukast during allergy seasons Follow-up as needed for persistent or progressing symptoms.

## 2023-01-07 NOTE — Progress Notes (Signed)
Acute Office Visit   Patient: Kristina [name]   DOB: Feb 23, 1996   27 y.o. Female  MRN: [id number] Visit Date: 01/07/2023  Today's healthcare provider: Oswaldo Conroy Triana Coover, PA-C  Introduced myself to the patient as a Secondary school teacher and provided education on APPs in clinical practice.    Chief Complaint  Patient presents with   Sinusitis    Accompanied with sneezing, itchy watery eyes, nasal congestion and facial pressure.    Subjective    HPI HPI     Sinusitis    Additional comments: Accompanied with sneezing, itchy watery eyes, nasal congestion and facial pressure.       Last edited by Benay Pike, CMA on 01/07/2023  2:31 PM.      She reports having sinus congestion, rhinorrhea, itchy and watery eyes and facial pressure since the beginning of spring She has tried using Flonase for several weeks but this has not improved her symptoms  She has been taking Zyrtec several times per day and has been adding Benadryl at night but she continues to have sneezing fits and sinus pressure She reports using Levocetirizine in the past which helped significantly but she has not had it in some time  She reports some fever on Sunday but this has resolved    Medications: Outpatient Medications Prior to Visit  Medication Sig   Norgestimate-Ethinyl Estradiol Triphasic (TRI-LO-SPRINTEC) 0.18/0.215/0.25 MG-25 MCG tab Take 1 tablet by mouth daily.   No facility-administered medications prior to visit.    Review of Systems  Constitutional:  Positive for fever (on Sunday). Negative for chills.  HENT:  Positive for congestion, rhinorrhea, sinus pressure, sinus pain and sneezing. Negative for ear pain and sore throat.   Eyes:  Positive for discharge and itching.  Respiratory:  Negative for cough, shortness of breath and wheezing.   Gastrointestinal:  Negative for diarrhea, nausea and vomiting.  Neurological:  Positive for headaches. Negative for dizziness and light-headedness.        Objective    BP 114/72 (BP Location: Right Arm, Patient Position: Sitting, Cuff Size: Normal)   Pulse 93   Temp 97.6 F (36.4 C) (Oral)   Resp 14   Ht 5\' 3"  (1.6 m) Comment: measured  Wt 167 lb 9.6 oz (76 kg)   LMP 12/25/2022 (Exact Date)   SpO2 98%   BMI 29.69 kg/m    Physical Exam Vitals reviewed.  Constitutional:      General: She is awake.     Appearance: Normal appearance. She is well-developed and well-groomed.  HENT:     Head: Normocephalic and atraumatic.     Right Ear: Hearing, tympanic membrane and ear canal normal.     Left Ear: Hearing, tympanic membrane and ear canal normal.     Nose: Congestion present.     Comments: Mild erythema and dry skin noted along nostrils     Mouth/Throat:     Lips: Pink.     Mouth: Mucous membranes are moist.     Pharynx: Oropharynx is clear. Uvula midline. No oropharyngeal exudate or posterior oropharyngeal erythema.     Tonsils: No tonsillar exudate.  Eyes:     General: Gaze aligned appropriately. Allergic shiner present.     Extraocular Movements: Extraocular movements intact.     Conjunctiva/sclera: Conjunctivae normal.  Cardiovascular:     Rate and Rhythm: Normal rate and regular rhythm.     Pulses: Normal pulses.     Heart sounds: Normal heart  sounds. No murmur heard.    No friction rub. No gallop.  Pulmonary:     Effort: Pulmonary effort is normal.     Breath sounds: Normal breath sounds. No decreased air movement. No decreased breath sounds, wheezing, rhonchi or rales.  Musculoskeletal:     Cervical back: Normal range of motion and neck supple. Normal range of motion.     Right lower leg: No edema.     Left lower leg: No edema.  Neurological:     Mental Status: She is alert.  Psychiatric:        Behavior: Behavior is cooperative.       No results found for any visits on 01/07/23.  Assessment & Plan      No follow-ups on file.      Problem List Items Addressed This Visit       Other   Environmental and  seasonal allergies - Primary    Acute, recurrent concern Patient reports limited relief with current use of Zyrtec and Flonase Symptoms appear consistent with allergic rhinitis She reports previous relief with levocetirizine so we will send in a prescription for this to be taken once per day by mouth Recommend stopping Zyrtec use and using Xyzal instead Also recommend using azelastine nasal spray, ketotifen or olopatadine eyedrops for remaining symptom relief. Also recommend using nasal saline sprays, humidifier, reduction of exposures to allergens through cleaning and wearing a mask outside Reviewed timing for therapeutic effects recommend patient remains consistent with dosing while awaiting medications to kick in If symptoms or not resolving with above measures may need to add montelukast during allergy seasons Follow-up as needed for persistent or progressing symptoms.      Relevant Medications   levocetirizine (XYZAL) 5 MG tablet     No follow-ups on file.   I, Jini Horiuchi E Annalynn Centanni, PA-C, have reviewed all documentation for this visit. The documentation on 01/07/23 for the exam, diagnosis, procedures, and orders are all accurate and complete.   Jacquelin Hawking, MHS, PA-C [facility]

## 2023-01-29 ENCOUNTER — Other Ambulatory Visit: Payer: Self-pay

## 2023-01-29 ENCOUNTER — Other Ambulatory Visit: Payer: Self-pay | Admitting: Advanced Practice Midwife

## 2023-01-29 DIAGNOSIS — Z30011 Encounter for initial prescription of contraceptive pills: Secondary | ICD-10-CM

## 2023-01-30 ENCOUNTER — Other Ambulatory Visit: Payer: Self-pay

## 2023-01-30 MED ORDER — NORGESTIM-ETH ESTRAD TRIPHASIC 0.18/0.215/0.25 MG-25 MCG PO TABS
1.0000 | ORAL_TABLET | Freq: Every day | ORAL | 0 refills | Status: DC
  Filled 2023-01-30: qty 84, 84d supply, fill #0

## 2023-01-31 ENCOUNTER — Other Ambulatory Visit: Payer: Self-pay

## 2023-02-20 ENCOUNTER — Telehealth: Payer: 59 | Admitting: Physician Assistant

## 2023-02-20 ENCOUNTER — Other Ambulatory Visit: Payer: Self-pay

## 2023-02-20 DIAGNOSIS — J029 Acute pharyngitis, unspecified: Secondary | ICD-10-CM

## 2023-02-20 MED ORDER — PREDNISONE 20 MG PO TABS
40.0000 mg | ORAL_TABLET | Freq: Every day | ORAL | 0 refills | Status: DC
  Filled 2023-02-20: qty 6, 3d supply, fill #0

## 2023-02-20 MED ORDER — AZITHROMYCIN 200 MG/5ML PO SUSR
ORAL | 0 refills | Status: DC
  Filled 2023-02-20: qty 45, 5d supply, fill #0

## 2023-02-20 NOTE — Progress Notes (Signed)
   Virtual Visit via Video Note   I, Piedad Climes, connected with  Denijah Karrer  ([id number], [birth year]) on 02/20/23 at  8:15 AM EDT by a video-enabled telemedicine application and verified that I am speaking with the correct person using two identifiers.  Location: Patient: Virtual Visit Location Patient: Home Provider: Virtual Visit Location Provider: Home Office   I discussed the limitations of evaluation and management by telemedicine and the availability of in person appointments. The patient expressed understanding and agreed to proceed.    History of Present Illness: Kristina [name] is a 27 y.o. who identifies as a female who was assigned female at birth, and is being seen today for sore throat starting yesterday but rapidly progressed overnight. Notes substantial odynophagia. Yesterday evening with chills and flush. Took Tylenol with this. Now noting white patches at the back of the throat.   HPI: HPI  Problems:  Patient Active Problem List   Diagnosis Date Noted   Environmental and seasonal allergies 01/02/2023    Allergies: No Known Allergies Medications:  Current Outpatient Medications:    azithromycin (ZITHROMAX) 200 MG/5ML suspension, Take 12 ml PO on Day 1. Then take 6 ml daily for 4 additional days., Disp: 36 mL, Rfl: 0   predniSONE (DELTASONE) 20 MG tablet, Take 2 tablets (40 mg total) by mouth daily with breakfast., Disp: 6 tablet, Rfl: 0   levocetirizine (XYZAL) 5 MG tablet, Take 1 tablet (5 mg total) by mouth every evening., Disp: 30 tablet, Rfl: 2   Norgestimate-Ethinyl Estradiol Triphasic (TRI-VYLIBRA LO) 0.18/0.215/0.25 MG-25 MCG tab, Take 1 tablet by mouth daily., Disp: 84 tablet, Rfl: 0  Observations/Objective: Patient is well-developed, well-nourished in no acute distress.  Resting comfortably at home.  Head is normocephalic, atraumatic.  No labored breathing. Speech is clear and coherent with logical content.  Patient is alert  and oriented at baseline.  Substantial oropharyngeal erythema noted. Uvula midline without edema or lesion. Tonsillar exudate noted bilaterally.   Assessment and Plan: 1. Exudative pharyngitis - azithromycin (ZITHROMAX) 200 MG/5ML suspension; Take 12 ml PO on Day 1. Then take 6 ml daily for 4 additional days.  Dispense: 36 mL; Refill: 0 - predniSONE (DELTASONE) 20 MG tablet; Take 2 tablets (40 mg total) by mouth daily with breakfast.  Dispense: 6 tablet; Refill: 0  Supportive measures and OTC medications reviewed. Azithromycin suspension and prednisone per orders. Declines work note. In-person follow-up precautions reviewed.   Follow Up Instructions: I discussed the assessment and treatment plan with the patient. The patient was provided an opportunity to ask questions and all were answered. The patient agreed with the plan and demonstrated an understanding of the instructions.  A copy of instructions were sent to the patient via MyChart unless otherwise noted below.   The patient was advised to call back or seek an in-person evaluation if the symptoms worsen or if the condition fails to improve as anticipated.  Time:  I spent 10 minutes with the patient via telehealth technology discussing the above problems/concerns.    Piedad Climes, PA-C

## 2023-02-20 NOTE — Patient Instructions (Signed)
Enid Teryl Lucy, thank you for joining Piedad Climes, PA-C for today's virtual visit.  While this provider is not your primary care provider (PCP), if your PCP is located in our provider database this encounter information will be shared with them immediately following your visit.   A [facility] MyChart account gives you access to today's visit and all your visits, tests, and labs performed at [facility] " click here if you don't have a [facility] MyChart account or go to [email or web address]  Consent: (Patient) Kristina [name] provided verbal consent for this virtual visit at the beginning of the encounter.  Current Medications:  Current Outpatient Medications:    levocetirizine (XYZAL) 5 MG tablet, Take 1 tablet (5 mg total) by mouth every evening., Disp: 30 tablet, Rfl: 2   Norgestimate-Ethinyl Estradiol Triphasic (TRI-VYLIBRA LO) 0.18/0.215/0.25 MG-25 MCG tab, Take 1 tablet by mouth daily., Disp: 84 tablet, Rfl: 0   Medications ordered in this encounter:  No orders of the defined types were placed in this encounter.    *If you need refills on other medications prior to your next appointment, please contact your pharmacy*  Follow-Up: Call back or seek an in-person evaluation if the symptoms worsen or if the condition fails to improve as anticipated.  [facility] [phone]  Other Instructions Strep Throat, Adult Strep throat is an infection of the throat. It is caused by germs (bacteria). Strep throat is common during the cold months of the year. It mostly affects children who are 42-16 years old. However, people of all ages can get it at any time of the year. This infection spreads from person to person through coughing, sneezing, or having close contact. What are the causes? This condition is caused by the Streptococcus pyogenes germ. What increases the risk? You care for young children. Children are more likely to get  strep throat and may spread it to others. You go to crowded places. Germs can spread easily in such places. You kiss or touch someone who has strep throat. What are the signs or symptoms? Fever or chills. Redness, swelling, or pain in the tonsils or throat. Pain or trouble when swallowing. White or yellow spots on the tonsils or throat. Tender glands in the neck and under the jaw. Bad breath. Red rash all over the body. This is rare. How is this treated? Medicines that kill germs (antibiotics). Medicines that treat pain or fever. These include: Ibuprofen or acetaminophen. Aspirin, only for people who are over the age of 88. Cough drops. Throat sprays. Follow these instructions at home: Medicines  Take over-the-counter and prescription medicines only as told by your doctor. Take your antibiotic medicine as told by your doctor. Do not stop taking the antibiotic even if you start to feel better. Eating and drinking  If you have trouble swallowing, eat soft foods until your throat feels better. Drink enough fluid to keep your pee (urine) pale yellow. To help with pain, you may have: Warm fluids, such as soup and tea. Cold fluids, such as frozen desserts or popsicles. General instructions Rinse your mouth (gargle) with a salt-water mixture 3-4 times a day or as needed. To make a salt-water mixture, dissolve -1 tsp (3-6 g) of salt in 1 cup (237 mL) of warm water. Rest as much as you can. Stay home from work or school until you have been taking antibiotics for 24 hours. Do not smoke or use any products that contain nicotine or tobacco. If you need  help quitting, ask your doctor. Keep all follow-up visits. How is this prevented?  Do not share food, drinking cups, or personal items. They can cause the germs to spread. Wash your hands well with soap and water. Make sure that all people in your house wash their hands well. Have family members tested if they have a fever or a sore  throat. They may need an antibiotic if they have strep throat. Contact a doctor if: You have swelling in your neck that keeps getting bigger. You get a rash, cough, or earache. You cough up a thick fluid that is green, yellow-brown, or bloody. You have pain that does not get better with medicine. Your symptoms get worse instead of getting better. You have a fever. Get help right away if: You vomit. You have a very bad headache. Your neck hurts or feels stiff. You have chest pain or are short of breath. You have drooling, very bad throat pain, or changes in your voice. Your neck is swollen, or the skin gets red and tender. Your mouth is dry, or you are peeing less than normal. You keep feeling more tired or have trouble waking up. Your joints are red or painful. These symptoms may be an emergency. Do not wait to see if the symptoms will go away. Get help right away. Call your local emergency services (911 in the [place] Summary Strep throat is an infection of the throat. It is caused by germs (bacteria). This infection can spread from person to person through coughing, sneezing, or having close contact. Take your medicines, including antibiotics, as told by your doctor. Do not stop taking the antibiotic even if you start to feel better. To prevent the spread of germs, wash your hands well with soap and water. Have others do the same. Do not share food, drinking cups, or personal items. Get help right away if you have a bad headache, chest pain, shortness of breath, a stiff or painful neck, or you vomit. This information is not intended to replace advice given to you by your health care provider. Make sure you discuss any questions you have with your health care provider. Document Revised: 12/19/2020 Document Reviewed: 12/19/2020 Elsevier Patient Education  2024 Elsevier Inc.    If you have been instructed to have an in-person evaluation today at a local Urgent Care facility, please use  the link below. It will take you to a list of all of our available [facility], including address, phone number and hours of operation. Please do not delay care.  [facility]  If you or a family member do not have a primary care provider, use the link below to schedule a visit and establish care. When you choose a [facility] primary care physician or advanced practice provider, you gain a long-term partner in health. Find a Primary Care Provider  Learn more about [facility] in-office and virtual care options: [facility] - Get Care Now

## 2023-02-24 ENCOUNTER — Ambulatory Visit (INDEPENDENT_AMBULATORY_CARE_PROVIDER_SITE_OTHER): Payer: 59 | Admitting: Family Medicine

## 2023-02-24 ENCOUNTER — Encounter: Payer: Self-pay | Admitting: Family Medicine

## 2023-02-24 ENCOUNTER — Other Ambulatory Visit: Payer: Self-pay

## 2023-02-24 VITALS — BP 118/77 | HR 85 | Wt 172.5 lb

## 2023-02-24 DIAGNOSIS — Z7689 Persons encountering health services in other specified circumstances: Secondary | ICD-10-CM

## 2023-02-24 DIAGNOSIS — R059 Cough, unspecified: Secondary | ICD-10-CM | POA: Diagnosis not present

## 2023-02-24 DIAGNOSIS — J3089 Other allergic rhinitis: Secondary | ICD-10-CM | POA: Diagnosis not present

## 2023-02-24 MED ORDER — BENZONATATE 100 MG PO CAPS
100.0000 mg | ORAL_CAPSULE | Freq: Two times a day (BID) | ORAL | 0 refills | Status: DC | PRN
  Filled 2023-02-24: qty 40, 20d supply, fill #0

## 2023-02-24 NOTE — Patient Instructions (Signed)
Please use the Tessalon perles 100mg  twice daily as needed (can take two tabs twice daily as needed) for cough   Please drink warm tea with honey to help decrease cough   Please return to care if cough if not improved over the course of the next 6 weeks   It can sometimes take up to 2 months for the cough to resolve after an upper respiratory infection

## 2023-02-24 NOTE — Assessment & Plan Note (Signed)
Acute Symptoms consistent with postinfectious cough Counseled patient that cough can last for up to 8 weeks Recommended warm teas with honey as natural antitussives Prescribed Tessalon Perles 100-200 mg twice daily as needed Recommended that patient sleep with humidifier Also recommended patient use Astepro nasal spray for postnasal drip, patient states that she has this at home and will trial nasal spray, demonstrated nasal spray technique to which patient voiced understanding

## 2023-02-24 NOTE — Assessment & Plan Note (Signed)
Chronic Symptoms well-controlled on Xyzal 5 mg daily Continue current medications

## 2023-02-24 NOTE — Progress Notes (Signed)
I,Sha'taria Tyson,acting as a Neurosurgeon for [employer], MD.,have documented all relevant documentation on the behalf of Ronnald Ramp, MD,as directed by  Ronnald Ramp, MD while in the presence of Ronnald Ramp, MD.   New patient visit   Patient: Kristina [name]   DOB: Nov 28, 1995   26 y.o. Female  MRN: [id number] Visit Date: 02/24/2023  Today's healthcare provider: Ronnald Ramp, MD   No chief complaint on file.  Subjective    Kristina [name] is a 27 y.o. female who presents today as a new patient to establish care.  HPI   Encounter to Establish Care Patient presents to establish care  Introduced myself and my role as primary care physician  We reviewed patient's medical, surgical, and social history and medications as listed below    PMHX   Last annual physical: 12/2022   Allergy to Pollen  Xyzal 5mg  daily  Reports improved symptoms   Contraception: TRI-VYLIBRA LO, requesting to take over this prescription with PCP   Social Hx  Tobacco use: never  Alcohol Use : rarely  Illicit drug use: denies     Health Maint Declines additional COVID vaccines  Went to GYN when pregnant with her daughter  Had normal pap smear in 2022    Concerns for Today:   Sore throat & cough : reports that she had virtual visit one week ago, she was treated with azithromycin and prednisone, reports that the cough has persistently been bothersome, she reports having dry throat and noticing some phlegm, reports that her throat feels scratchy, she reports that the cough is more frequent at night and interrupts her sleep    Past Medical History:  Diagnosis Date   No pertinent past medical history    Past Surgical History:  Procedure Laterality Date   WISDOM TOOTH EXTRACTION     Family Status  Relation Name Status   Mother  Alive   Father  Alive   Family History  Problem Relation Age of Onset   Hypertension  Mother    Social History   Socioeconomic History   Marital status: Married    Spouse name: Ozzy   Number of children: 1   Years of education: Not on file   Highest education level: Associate degree: academic program  Occupational History    Employer: [employer]  Tobacco Use   Smoking status: Never   Smokeless tobacco: Never  Vaping Use   Vaping Use: Never used  Substance and Sexual Activity   Alcohol use: Not Currently   Drug use: Never   Sexual activity: Yes    Partners: Male    Birth control/protection: Pill  Other Topics Concern   Not on file  Social History Narrative   Not on file   Social Determinants of Health   Financial Resource Strain: Low Risk  (02/20/2023)   Overall Financial Resource Strain (CARDIA)    Difficulty of Paying Living Expenses: Not hard at all  Food Insecurity: No Food Insecurity (02/20/2023)   Hunger Vital Sign    Worried About Running Out of Food in the Last Year: Never true    Ran Out of Food in the Last Year: Never true  Transportation Needs: No Transportation Needs (02/20/2023)   PRAPARE - Administrator, Civil Service (Medical): No    Lack of Transportation (Non-Medical): No  Physical Activity: Insufficiently Active (02/20/2023)   Exercise Vital Sign    Days of Exercise per Week: 3 days    Minutes of Exercise  per Session: 30 min  Stress: No Stress Concern Present (02/20/2023)   [employer] of Occupational Health - Occupational Stress Questionnaire    Feeling of Stress : Only a little  Social Connections: Moderately Integrated (02/20/2023)   Social Connection and Isolation Panel [NHANES]    Frequency of Communication with Friends and Family: More than three times a week    Frequency of Social Gatherings with Friends and Family: More than three times a week    Attends Religious Services: 1 to 4 times per year    Active Member of [employer] or Organizations: No    Attends Engineer, structural: Not on file    Marital  Status: Married   Outpatient Medications Prior to Visit  Medication Sig   levocetirizine (XYZAL) 5 MG tablet Take 1 tablet (5 mg total) by mouth every evening.   Norgestimate-Ethinyl Estradiol Triphasic (TRI-VYLIBRA LO) 0.18/0.215/0.25 MG-25 MCG tab Take 1 tablet by mouth daily.   [DISCONTINUED] azithromycin (ZITHROMAX) 200 MG/5ML suspension Take 12 ml by mouth on day 1 then take 6 ml daily for 4 days. Discard remainder after 5 days. (Patient not taking: Reported on 02/24/2023)   [DISCONTINUED] predniSONE (DELTASONE) 20 MG tablet Take 2 tablets (40 mg total) by mouth daily with breakfast. (Patient not taking: Reported on 02/24/2023)   No facility-administered medications prior to visit.   No Known Allergies  Immunization History  Administered Date(s) Administered   Dtap, Unspecified 10/22/1995, 12/17/1995, 02/12/1996, 03/16/1997, 01/08/2001   HIB (PRP-OMP) 10/22/1995, 12/17/1995, 09/16/1996, 02/11/1997   HPV Bivalent 09/10/2013, 11/10/2013   Hepatitis A 03/22/2007, 07/10/2012   IPV 10/22/1995, 12/17/1995, 02/12/1996, 01/08/2001   Influenza-Unspecified 07/10/2012, 09/05/2017   MMR 09/16/1996, 01/15/2001   Meningococcal Conjugate 04/09/2007, 07/10/2012   PFIZER(Purple Top)SARS-COV-2 Vaccination 07/21/2020, 08/11/2020   PPD Test 09/15/2020   Tdap 04/09/2007, 09/15/2020, 10/17/2021   Varicella 11/05/2012    Health Maintenance  Topic Date Due   COVID-19 Vaccine (3 - 2023-24 season) 05/10/2022   HPV VACCINES (3 - 3-dose series) 01/07/2024 (Originally 03/10/2014)   INFLUENZA VACCINE  04/10/2023   PAP-Cervical Cytology Screening  06/04/2024   PAP SMEAR-Modifier  06/04/2024   DTaP/Tdap/Td (9 - Td or Tdap) 10/18/2031   Hepatitis C Screening  Completed   HIV Screening  Completed    Patient Care Team: Ronnald Ramp, MD as PCP - General (Family Medicine)  Review of Systems  HENT:  Positive for rhinorrhea and sore throat.   Respiratory:  Positive for cough.         Objective    BP 118/77 (BP Location: Right Arm, Patient Position: Sitting, Cuff Size: Normal)   Pulse 85   Wt 172 lb 8 oz (78.2 kg)   SpO2 100%   BMI 30.56 kg/m    Physical Exam Vitals reviewed.  Constitutional:      General: She is not in acute distress.    Appearance: Normal appearance. She is not ill-appearing, toxic-appearing or diaphoretic.  HENT:     Nose:     Right Turbinates: Enlarged.     Left Turbinates: Enlarged.     Mouth/Throat:     Mouth: Mucous membranes are moist.     Pharynx: No posterior oropharyngeal erythema.     Tonsils: No tonsillar exudate.     Comments: Oropharyngeal mucus visualized on exam She has no tonsillar exudate Eyes:     Conjunctiva/sclera: Conjunctivae normal.  Cardiovascular:     Rate and Rhythm: Normal rate and regular rhythm.     Pulses: Normal pulses.  Heart sounds: Normal heart sounds. No murmur heard.    No friction rub. No gallop.  Pulmonary:     Effort: Pulmonary effort is normal. No respiratory distress.     Breath sounds: Normal breath sounds. No stridor. No wheezing, rhonchi or rales.  Abdominal:     General: Bowel sounds are normal. There is no distension.     Palpations: Abdomen is soft.     Tenderness: There is no abdominal tenderness.  Musculoskeletal:     Right lower leg: No edema.     Left lower leg: No edema.  Skin:    Findings: No erythema or rash.  Neurological:     Mental Status: She is alert and oriented to person, place, and time.     Depression Screen    02/24/2023    3:12 PM 01/02/2023    8:31 AM 01/18/2022    3:00 PM 07/04/2020    8:21 AM  PHQ 2/9 Scores  PHQ - 2 Score 0 0 0 0  PHQ- 9 Score 0 0 0 2   No results found for any visits on 02/24/23.  Assessment & Plan      Problem List Items Addressed This Visit       Other   Environmental and seasonal allergies    Chronic Symptoms well-controlled on Xyzal 5 mg daily Continue current medications       Cough in adult - Primary     Acute Symptoms consistent with postinfectious cough Counseled patient that cough can last for up to 8 weeks Recommended warm teas with honey as natural antitussives Prescribed Tessalon Perles 100-200 mg twice daily as needed Recommended that patient sleep with humidifier Also recommended patient use Astepro nasal spray for postnasal drip, patient states that she has this at home and will trial nasal spray, demonstrated nasal spray technique to which patient voiced understanding      Relevant Medications   benzonatate (TESSALON) 100 MG capsule   Establishing care with new doctor, encounter for    Welcomed patient to [facility]  Reviewed patient's medical history, medications, surgical and social history Discussed roles and expectations for primary care physician-patient relationship Recommended patient schedule annual preventative examinations          Return if symptoms worsen or fail to improve.       The entirety of the information documented in the History of Present Illness, Review of Systems and Physical Exam were personally obtained by me. Portions of this information were initially documented by Sha'taria Tyson,CMA. I, Ronnald Ramp, MD have reviewed the documentation above for thoroughness and accuracy.      Ronnald Ramp, MD  [facility] [phone] (phone) [phone] (fax)  [facility]

## 2023-02-24 NOTE — Assessment & Plan Note (Signed)
Welcomed patient to [facility]  Reviewed patient's medical history, medications, surgical and social history Discussed roles and expectations for primary care physician-patient relationship Recommended patient schedule annual preventative examinations   

## 2023-04-14 ENCOUNTER — Other Ambulatory Visit: Payer: Self-pay

## 2023-04-14 ENCOUNTER — Other Ambulatory Visit: Payer: Self-pay | Admitting: Physician Assistant

## 2023-04-14 ENCOUNTER — Other Ambulatory Visit: Payer: Self-pay | Admitting: Advanced Practice Midwife

## 2023-04-14 DIAGNOSIS — J3089 Other allergic rhinitis: Secondary | ICD-10-CM

## 2023-04-14 DIAGNOSIS — Z30011 Encounter for initial prescription of contraceptive pills: Secondary | ICD-10-CM

## 2023-04-15 ENCOUNTER — Other Ambulatory Visit: Payer: Self-pay

## 2023-04-15 MED FILL — Levocetirizine Dihydrochloride Tab 5 MG: ORAL | 30 days supply | Qty: 30 | Fill #0 | Status: AC

## 2023-04-15 NOTE — Telephone Encounter (Signed)
Requested medication (s) are due for refill today: Yes  Requested medication (s) are on the active medication list: Yes  Last refill:  01/07/23  Future visit scheduled: No  Notes to clinic:  Unable to refill per protocol, Rx expired.      Requested Prescriptions  Pending Prescriptions Disp Refills   levocetirizine (XYZAL) 5 MG tablet 30 tablet 2    Sig: Take 1 tablet (5 mg total) by mouth every evening.     Ear, Nose, and Throat:  Antihistamines - levocetirizine dihydrochloride Passed - 04/14/2023  1:10 PM      Passed - Cr in normal range and within 360 days    Creatinine, Ser  Date Value Ref Range Status  01/02/2023 0.59 0.57 - 1.00 mg/dL Final         Passed - eGFR is 10 or above and within 360 days    eGFR  Date Value Ref Range Status  01/02/2023 127 >59 mL/min/1.73 Final         Passed - Valid encounter within last 12 months    Recent Outpatient Visits           1 month ago Cough in adult   [facility]Robinson, [place], MD   3 months ago Environmental and seasonal allergies   [facility], Oswaldo Conroy, PA-C   3 months ago Annual physical exam   [facility], Nyoka Cowden, MD   7 months ago Strep pharyngitis   [facility], Ocie Bob, MD   1 year ago Strep pharyngitis   [facility] at [facility], Ocie Bob, MD

## 2023-04-17 ENCOUNTER — Other Ambulatory Visit: Payer: Self-pay

## 2023-04-18 ENCOUNTER — Other Ambulatory Visit: Payer: Self-pay | Admitting: Family Medicine

## 2023-04-18 ENCOUNTER — Other Ambulatory Visit: Payer: Self-pay

## 2023-04-18 ENCOUNTER — Encounter: Payer: Self-pay | Admitting: Family Medicine

## 2023-04-18 DIAGNOSIS — Z30011 Encounter for initial prescription of contraceptive pills: Secondary | ICD-10-CM

## 2023-04-18 MED ORDER — NORGESTIM-ETH ESTRAD TRIPHASIC 0.18/0.215/0.25 MG-25 MCG PO TABS
1.0000 | ORAL_TABLET | Freq: Every day | ORAL | 3 refills | Status: DC
Start: 2023-04-18 — End: 2024-01-10
  Filled 2023-04-18: qty 84, 84d supply, fill #0
  Filled 2023-08-09: qty 84, 84d supply, fill #1

## 2023-05-08 ENCOUNTER — Encounter: Payer: 59 | Admitting: Internal Medicine

## 2023-05-14 ENCOUNTER — Encounter: Payer: 59 | Admitting: Internal Medicine

## 2023-08-09 ENCOUNTER — Other Ambulatory Visit: Payer: Self-pay

## 2023-08-10 ENCOUNTER — Other Ambulatory Visit: Payer: Self-pay

## 2023-11-18 ENCOUNTER — Encounter: Payer: Self-pay | Admitting: Family Medicine

## 2023-11-18 DIAGNOSIS — J3089 Other allergic rhinitis: Secondary | ICD-10-CM

## 2023-11-18 NOTE — Telephone Encounter (Signed)
Please see the message below and advise.

## 2023-11-19 ENCOUNTER — Other Ambulatory Visit: Payer: Self-pay

## 2023-11-19 MED ORDER — LEVOCETIRIZINE DIHYDROCHLORIDE 5 MG PO TABS
5.0000 mg | ORAL_TABLET | Freq: Two times a day (BID) | ORAL | 6 refills | Status: AC
  Filled 2023-11-19: qty 60, 30d supply, fill #0
  Filled 2024-01-28: qty 60, 30d supply, fill #1

## 2023-11-21 ENCOUNTER — Other Ambulatory Visit: Payer: Self-pay

## 2024-01-08 ENCOUNTER — Ambulatory Visit (INDEPENDENT_AMBULATORY_CARE_PROVIDER_SITE_OTHER): Payer: Self-pay | Admitting: Family Medicine

## 2024-01-08 ENCOUNTER — Encounter: Payer: Self-pay | Admitting: Family Medicine

## 2024-01-08 VITALS — BP 109/88 | HR 83 | Ht 63.0 in | Wt 177.0 lb

## 2024-01-08 DIAGNOSIS — Z131 Encounter for screening for diabetes mellitus: Secondary | ICD-10-CM | POA: Diagnosis not present

## 2024-01-08 DIAGNOSIS — Z0000 Encounter for general adult medical examination without abnormal findings: Secondary | ICD-10-CM | POA: Diagnosis not present

## 2024-01-08 DIAGNOSIS — Z130 Encounter for screening for diseases of the blood and blood-forming organs and certain disorders involving the immune mechanism: Secondary | ICD-10-CM | POA: Diagnosis not present

## 2024-01-08 DIAGNOSIS — Z13220 Encounter for screening for lipoid disorders: Secondary | ICD-10-CM | POA: Diagnosis not present

## 2024-01-08 DIAGNOSIS — Z6831 Body mass index (BMI) 31.0-31.9, adult: Secondary | ICD-10-CM

## 2024-01-08 NOTE — Progress Notes (Unsigned)
 Complete physical exam   Patient: Kristina [name]   DOB: 1996-01-09   27 y.o. Female  MRN: [id number] Visit Date: 01/08/2024  Today's healthcare provider: Mimi Alt, MD   Chief Complaint  Patient presents with  . Annual Exam    No concern    Subjective    Kristina [name] is a 28 y.o. female who presents today for a complete physical exam.   She reports consuming a {diet types:17450} diet.    Patient walks for 30 mins per day 5 days per week and is looking to be in the gym more      She {does/does not:200015} have additional problems to discuss today.   Discussed the use of AI scribe software for clinical note transcription with the patient, who gave verbal consent to proceed.  History of Present Illness      Past Medical History:  Diagnosis Date  . No pertinent past medical history    Past Surgical History:  Procedure Laterality Date  . WISDOM TOOTH EXTRACTION     Social History   Socioeconomic History  . Marital status: Married    Spouse name: Ozzy  . Number of children: 1  . Years of education: Not on file  . Highest education level: Associate degree: occupational, Scientist, product/process development, or vocational program  Occupational History    Employer: [employer]  Tobacco Use  . Smoking status: Never  . Smokeless tobacco: Never  Vaping Use  . Vaping status: Never Used  Substance and Sexual Activity  . Alcohol use: Not Currently  . Drug use: Never  . Sexual activity: Yes    Partners: Male    Birth control/protection: Pill  Other Topics Concern  . Not on file  Social History Narrative  . Not on file   Social Drivers of Health   Financial Resource Strain: Low Risk  (01/07/2024)   Overall Financial Resource Strain (CARDIA)   . Difficulty of Paying Living Expenses: Not hard at all  Food Insecurity: No Food Insecurity (01/07/2024)   Hunger Vital Sign   . Worried About Programme researcher, broadcasting/film/video in the Last Year: Never true   . Ran Out  of Food in the Last Year: Never true  Transportation Needs: No Transportation Needs (01/07/2024)   PRAPARE - Transportation   . Lack of Transportation (Medical): No   . Lack of Transportation (Non-Medical): No  Physical Activity: Sufficiently Active (01/07/2024)   Exercise Vital Sign   . Days of Exercise per Week: 5 days   . Minutes of Exercise per Session: 60 min  Stress: No Stress Concern Present (01/07/2024)   [employer] of Occupational Health - Occupational Stress Questionnaire   . Feeling of Stress : Only a little  Social Connections: Moderately Integrated (01/07/2024)   Social Connection and Isolation Panel [NHANES]   . Frequency of Communication with Friends and Family: More than three times a week   . Frequency of Social Gatherings with Friends and Family: More than three times a week   . Attends Religious Services: More than 4 times per year   . Active Member of Clubs or Organizations: No   . Attends Banker Meetings: Not on file   . Marital Status: Married  Catering manager Violence: Not At Risk (01/08/2024)   Humiliation, Afraid, Rape, and Kick questionnaire   . Fear of Current or Ex-Partner: No   . Emotionally Abused: No   . Physically Abused: No   . Sexually Abused: No  Family Status  Relation Name Status  . Mother  Alive  . Father  Alive  No partnership data on file   Family History  Problem Relation Age of Onset  . Hypertension Mother    No Known Allergies   Medications: Outpatient Medications Prior to Visit  Medication Sig  . levocetirizine (XYZAL ) 5 MG tablet Take 1 tablet (5 mg total) by mouth 2 (two) times daily.  . benzonatate  (TESSALON ) 100 MG capsule Take 1 capsule (100 mg total) by mouth 2 (two) times daily as needed for cough.  . Norgestim-Eth Estrad Triphasic (NORGESTIMATE -ETHINYL ESTRADIOL TRIPHASIC) 0.18/0.215/0.25 MG-25 MCG tab Take 1 tablet by mouth daily. (Patient not taking: Reported on 01/08/2024)   No facility-administered  medications prior to visit.    Review of Systems  {Insert previous labs (optional):23779} {See past labs  Heme  Chem  Endocrine  Serology  Results Review (optional):1}  Objective    BP 109/88   Pulse 83   Ht 5\' 3"  (1.6 m)   Wt 177 lb (80.3 kg)   SpO2 100%   BMI 31.35 kg/m  {Insert last BP/Wt (optional):23777}{See vitals history (optional):1}    Physical Exam  ***  Last depression screening scores    02/24/2023    3:12 PM 01/02/2023    8:31 AM 01/18/2022    3:00 PM  PHQ 2/9 Scores  PHQ - 2 Score 0 0 0  PHQ- 9 Score 0 0 0    Last fall risk screening    02/24/2023    3:11 PM  Fall Risk   Falls in the past year? 0  Number falls in past yr: 0  Injury with Fall? 0  Risk for fall due to : No Fall Risks  Follow up Falls evaluation completed    Last Audit-C alcohol use screening    01/07/2024    9:20 AM  Alcohol Use Disorder Test (AUDIT)  1. How often do you have a drink containing alcohol? 1  3. How often do you have six or more drinks on one occasion? 0   A score of 3 or more in women, and 4 or more in men indicates increased risk for alcohol abuse, EXCEPT if all of the points are from question 1   No results found for any visits on 01/08/24.  Assessment & Plan    Routine Health Maintenance and Physical Exam  Immunization History  Administered Date(s) Administered  . Dtap, Unspecified 10/22/1995, 12/17/1995, 02/12/1996, 03/16/1997, 01/08/2001  . HIB (PRP-OMP) 10/22/1995, 12/17/1995, 09/16/1996, 02/11/1997  . HPV Bivalent 09/10/2013, 11/10/2013  . Hepatitis A 03/22/2007, 07/10/2012  . IPV 10/22/1995, 12/17/1995, 02/12/1996, 01/08/2001  . Influenza-Unspecified 07/10/2012, 09/05/2017  . MMR 09/16/1996, 01/15/2001  . Meningococcal Conjugate 04/09/2007, 07/10/2012  . PFIZER(Purple Top)SARS-COV-2 Vaccination 07/21/2020, 08/11/2020  . PPD Test 09/15/2020  . Tdap 04/09/2007, 09/15/2020, 10/17/2021  . Varicella 11/05/2012    Health Maintenance  Topic Date  Due  . HPV VACCINES (3 - 3-dose series) 03/10/2014  . COVID-19 Vaccine (3 - 2024-25 season) 05/11/2023  . INFLUENZA VACCINE  04/09/2024  . Cervical Cancer Screening (Pap smear)  06/04/2024  . DTaP/Tdap/Td (9 - Td or Tdap) 10/18/2031  . Hepatitis C Screening  Completed  . HIV Screening  Completed  . Meningococcal B Vaccine  Aged Out    Problem List Items Addressed This Visit   None   Assessment and Plan Assessment & Plan        No follow-ups on file.       Judyann Number  Simmons-Robinson, MD  [facility] [phone] (phone) [phone] (fax)  [facility]

## 2024-01-09 ENCOUNTER — Encounter: Payer: Self-pay | Admitting: Family Medicine

## 2024-01-09 LAB — CBC
Hematocrit: 40 % (ref 34.0–46.6)
Hemoglobin: 13.3 g/dL (ref 11.1–15.9)
MCH: 28.9 pg (ref 26.6–33.0)
MCHC: 33.3 g/dL (ref 31.5–35.7)
MCV: 87 fL (ref 79–97)
Platelets: 334 10*3/uL (ref 150–450)
RBC: 4.6 x10E6/uL (ref 3.77–5.28)
RDW: 12.4 % (ref 11.7–15.4)
WBC: 6.4 10*3/uL (ref 3.4–10.8)

## 2024-01-09 LAB — BMP8+EGFR
BUN/Creatinine Ratio: 20 (ref 9–23)
BUN: 13 mg/dL (ref 6–20)
CO2: 22 mmol/L (ref 20–29)
Calcium: 9.5 mg/dL (ref 8.7–10.2)
Chloride: 102 mmol/L (ref 96–106)
Creatinine, Ser: 0.64 mg/dL (ref 0.57–1.00)
Glucose: 86 mg/dL (ref 70–99)
Potassium: 4.3 mmol/L (ref 3.5–5.2)
Sodium: 138 mmol/L (ref 134–144)
eGFR: 123 mL/min/{1.73_m2} (ref >59)

## 2024-01-09 LAB — LIPID PANEL
Chol/HDL Ratio: 3.4 ratio (ref 0.0–4.4)
Cholesterol, Total: 155 mg/dL (ref 100–199)
HDL: 45 mg/dL (ref >39)
LDL Chol Calc (NIH): 94 mg/dL (ref 0–99)
Triglycerides: 84 mg/dL (ref 0–149)
VLDL Cholesterol Cal: 16 mg/dL (ref 5–40)

## 2024-01-09 LAB — HEMOGLOBIN A1C
Est. average glucose Bld gHb Est-mCnc: 120 mg/dL
Hgb A1c MFr Bld: 5.8 % — ABNORMAL HIGH (ref 4.8–5.6)

## 2024-01-10 ENCOUNTER — Encounter: Payer: Self-pay | Admitting: Family Medicine

## 2024-01-10 DIAGNOSIS — Z6831 Body mass index (BMI) 31.0-31.9, adult: Secondary | ICD-10-CM | POA: Insufficient documentation

## 2024-01-10 DIAGNOSIS — Z0000 Encounter for general adult medical examination without abnormal findings: Secondary | ICD-10-CM | POA: Insufficient documentation

## 2024-01-10 NOTE — Assessment & Plan Note (Signed)
 Routine annual physical examination. She achieves the recommended 150 minutes of exercise per week through walking. Dietary habits include high intake of fried foods and challenges with incorporating vegetables. - Order CBC, CMP, A1c, and lipid panel. - Encourage continuation of current exercise routine, maintaining at least 150 minutes per week. - Advise on healthier dietary options, including using an air fryer and incorporating vegetables like broccoli and bell peppers into meals.

## 2024-01-28 ENCOUNTER — Other Ambulatory Visit: Payer: Self-pay

## 2024-02-12 ENCOUNTER — Ambulatory Visit: Payer: Self-pay | Admitting: Family Medicine

## 2024-08-12 ENCOUNTER — Ambulatory Visit: Payer: Self-pay

## 2024-08-12 ENCOUNTER — Telehealth: Admitting: Physician Assistant

## 2024-08-12 ENCOUNTER — Encounter: Payer: Self-pay | Admitting: Physician Assistant

## 2024-08-12 DIAGNOSIS — J018 Other acute sinusitis: Secondary | ICD-10-CM

## 2024-08-12 MED ORDER — BENZONATATE 100 MG PO CAPS: 100.0000 mg | ORAL_CAPSULE | Freq: Two times a day (BID) | ORAL | 0 refills | Status: AC | PRN

## 2024-08-12 MED ORDER — AZITHROMYCIN 250 MG PO TABS: ORAL_TABLET | ORAL | 0 refills | Status: AC

## 2024-08-12 NOTE — Telephone Encounter (Signed)
 FYI Only or Action Required?: FYI only for provider: appointment scheduled on 12.4.25.  Patient was last seen in primary care on 01/08/2024 by Sharma Coyer, MD.  Called Nurse Triage reporting Cough.  Symptoms began several weeks ago.  Interventions attempted: Nothing.  Symptoms are: unchanged.  Triage Disposition: See PCP When Office is Open (Within 3 Days)  Patient/caregiver understands and will follow disposition?: Yes   Copied from CRM #[id number]. Topic: Clinical - Red Word Triage >> Aug 12, 2024  9:41 AM Drema MATSU wrote: Red Word that prompted transfer to Nurse Triage: Patient has a productive cough with discolored mucous for the past 2 weeks.   ----------------------------------------------------------------------- From previous Reason for Contact - Scheduling: Patient/patient representative is calling to schedule an appointment. Refer to attachments for appointment information. Reason for Disposition  Cough has been present for > 3 weeks  Answer Assessment - Initial Assessment Questions Cough x2 weeks, states dry cough but started to cough up green mucus. States cough is still there and dry and she only feels or expels the mucus when dry. States if she blows her nose it takes a lot of force to get the green mucus out as she feels like it is stuck way back there. Pt denies any chest pain, shortness of breath, fever, sinus pressure or any other symptoms.     1. ONSET: When did the cough begin?      2 weeks ago 2. SEVERITY: How bad is the cough today?      Mild to mod 3. SPUTUM: Describe the color of your sputum (e.g., none, dry cough; clear, white, yellow, green)     green 4. HEMOPTYSIS: Are you coughing up any blood? If Yes, ask: How much? (e.g., flecks, streaks, tablespoons, etc.)     denies 5. DIFFICULTY BREATHING: Are you having difficulty breathing? If Yes, ask: How bad is it? (e.g., mild, moderate, severe)      denies 6. FEVER: Do you have  a fever? If Yes, ask: What is your temperature, how was it measured, and when did it start?     denies 7. CARDIAC HISTORY: Do you have any history of heart disease? (e.g., heart attack, congestive heart failure)      no 8. LUNG HISTORY: Do you have any history of lung disease?  (e.g., pulmonary embolus, asthma, emphysema)     no 9. PE RISK FACTORS: Do you have a history of blood clots? (or: recent major surgery, recent prolonged travel, bedridden)     no 10. OTHER SYMPTOMS: Do you have any other symptoms? (e.g., runny nose, wheezing, chest pain)       denies  Protocols used: Cough - Acute Productive-A-AH

## 2024-08-12 NOTE — Progress Notes (Signed)
 MyChart Video Visit  Virtual Visit via Video Note   This format is felt to be most appropriate for this patient at this time. Physical exam was limited by quality of the video and audio technology used for the visit.   Provider location: Virtual Visit Location Provider: Office/Clinic Patient Location: Home  I discussed the limitations of evaluation and management by telemedicine and the availability of in person appointments. The patient expressed understanding and agreed to proceed.  Patient: Kristina [name]   DOB: Jan 14, 1996   28 y.o. Female  MRN: [id number] Visit Date: 08/12/2024  Today's healthcare provider: Jolynn Spencer, PA-C   No chief complaint on file.  Subjective     Discussed the use of AI scribe software for clinical note transcription with the patient, who gave verbal consent to proceed.  History of Present Illness Kristina [name] is a 28 year old female who presents with a persistent cough and nasal congestion. She is accompanied by her two-year-old daughter.  She has had a cough and runny nose for 2 weeks. The cough was initially productive with green mucus and congestion, without fever or body aches, and is now dry. She feels mucus in her throat that she cannot clear.  She has tried DayQuil, NyQuil, Flonase, and antihistamines including Allegra, Zyrtec, and Claritin without relief. She is not on any prescribed medications for this illness but previously had short-term relief with Tessalon  Perles. She denies any possibility of pregnancy and uses contraception consistently.  Her nasal congestion sometimes causes headaches and pressure under her eyelids, and she needs to blow her nose forcefully to clear mucus.  She has environmental allergies that typically flare in August, but she feels this episode is different because she usually does not have a cough with her allergies.     Medications: Outpatient Medications Prior to Visit  Medication Sig    levocetirizine (XYZAL ) 5 MG tablet Take 1 tablet (5 mg total) by mouth 2 (two) times daily.   No facility-administered medications prior to visit.    Review of Systems All negative  Except see HPI       Objective    There were no vitals taken for this visit.       Physical Exam  Constitutional:      General: She is not in acute distress.    Appearance: Normal appearance.  HENT:     Head: Normocephalic.  Pulmonary:     Effort: Pulmonary effort is normal. No respiratory distress.  Neurological:     Mental Status: She is alert and oriented to person, place, and time. Mental status is at baseline.       Assessment & Plan Acute sinusitis X 2-3 weeks -  symptoms c/w sinusitis   - given duration of symptoms, suspect bacterial etiology - discussed the potential negative side effects of antibiotics and that they can lead to resistance if used unnecessarily;  Previous OTC treatments ineffective. - Pescribed Z-Pak (azithromycin ). - Prescribed Tessalon  Perles for cough. - Advised nasal saline spray, Flonase, and Zyrtec. Advised to increase    Fluids intake.  Rest. Humidifier in bedroom. - Instructed to return if symptoms persist or worsen.   No follow-ups on file.     I discussed the assessment and treatment plan with the patient. The patient was provided an opportunity to ask questions and all were answered. The patient agreed with the plan and demonstrated an understanding of the instructions.   The patient was advised to call back or seek an  in-person evaluation if the symptoms worsen or if the condition fails to improve as anticipated.  I, Kadeidra Coryell, PA-C have reviewed all documentation for this visit. The documentation on 08/12/2024  for the exam, diagnosis, procedures, and orders are all accurate and complete.  Jolynn Spencer, [facility], MMS [facility] [phone] (phone) [phone] (fax)  [facility]

## 2024-08-17 ENCOUNTER — Ambulatory Visit: Admitting: Obstetrics & Gynecology

## 2024-08-24 ENCOUNTER — Ambulatory Visit: Admitting: Obstetrics & Gynecology

## 2024-08-26 ENCOUNTER — Ambulatory Visit: Admitting: Nurse Practitioner

## 2024-08-26 ENCOUNTER — Encounter: Payer: Self-pay | Admitting: Nurse Practitioner

## 2024-08-26 ENCOUNTER — Other Ambulatory Visit: Payer: Self-pay

## 2024-08-26 VITALS — BP 110/77 | HR 85 | Temp 97.0°F | Ht 63.0 in | Wt 170.0 lb

## 2024-08-26 DIAGNOSIS — J40 Bronchitis, not specified as acute or chronic: Secondary | ICD-10-CM | POA: Diagnosis not present

## 2024-08-26 MED ORDER — HYDROCOD POLI-CHLORPHE POLI ER 10-8 MG/5ML PO SUER
5.0000 mL | Freq: Two times a day (BID) | ORAL | 0 refills | Status: AC | PRN
  Filled 2024-08-26: qty 120, 12d supply, fill #0

## 2024-08-26 MED ORDER — PREDNISONE 10 MG PO TABS
ORAL_TABLET | ORAL | 0 refills | Status: AC
  Filled 2024-08-26: qty 21, 6d supply, fill #0

## 2024-08-26 MED FILL — Albuterol Sulfate Inhal Aero 108 MCG/ACT (90MCG Base Equiv): 2.0000 | RESPIRATORY_TRACT | 30 days supply | Qty: 6.7 | Fill #0 | Status: AC

## 2024-08-26 NOTE — Progress Notes (Signed)
 BP 110/77   Pulse 85   Temp (!) 97 F (36.1 C)   Ht 5' 3 (1.6 m)   Wt 170 lb (77.1 kg)   SpO2 97%   BMI 30.11 kg/m    Subjective:    Patient ID: Kristina [name], female    DOB: [birth year], 28 y.o.   MRN: [id number]  HPI: Kristina [name] is a 28 y.o. female  Chief Complaint  Patient presents with   Cough   Discussed the use of AI scribe software for clinical note transcription with the patient, who gave verbal consent to proceed.  History of Present Illness Kristina [name] is a 28 year old female who presents with a persistent cough and congestion.  Cough - Persistent for over two weeks - Initially productive with green sputum, now wet-sounding - Difficulty expectorating mucus, sensation of being unable to clear it - No fever, myalgias, otalgia, or dyspnea - Cough has worsened despite prior treatments - Tessalon  Perles provide minimal relief  Nasal congestion and rhinorrhea - Nasal congestion and rhinorrhea present for over two weeks - Sinus symptoms have improved after prior treatment - No current sinus pain  Prior and current treatments - Previously treated with Z-Pak for acute sinusitis - Used over-the-counter medications: DayQuil, Nyquil, Flonase, Allegra, Zyrtec, Claritin - Short-term relief from Tessalon  Perles - Has not used Mucinex yet         02/24/2023    3:12 PM 01/02/2023    8:31 AM 01/18/2022    3:00 PM  Depression screen PHQ 2/9  Decreased Interest 0 0 0  Down, Depressed, Hopeless 0 0 0  PHQ - 2 Score 0 0 0  Altered sleeping 0 0 0  Tired, decreased energy 0 0 0  Change in appetite 0 0 0  Feeling bad or failure about yourself  0 0 0  Trouble concentrating 0 0 0  Moving slowly or fidgety/restless 0 0 0  Suicidal thoughts 0 0 0  PHQ-9 Score 0  0  0   Difficult doing work/chores Not difficult at all Not difficult at all Not difficult at all     Data saved with a previous flowsheet row definition    Relevant past  medical, surgical, family and social history reviewed and updated as indicated. Interim medical history since our last visit reviewed. Allergies and medications reviewed and updated.  Review of Systems Ten systems reviewed and is negative except as mentioned in HPI      Objective:      BP 110/77   Pulse 85   Temp (!) 97 F (36.1 C)   Ht 5' 3 (1.6 m)   Wt 170 lb (77.1 kg)   SpO2 97%   BMI 30.11 kg/m    Wt Readings from Last 3 Encounters:  08/26/24 170 lb (77.1 kg)  01/08/24 177 lb (80.3 kg)  02/24/23 172 lb 8 oz (78.2 kg)    Physical Exam GENERAL: Alert, cooperative, well developed, no acute distress. HEENT: Normocephalic, normal oropharynx, moist mucous membranes. CHEST:wheezing noted, no rhonchi, or crackles. CARDIOVASCULAR: Normal heart rate and rhythm, S1 and S2 normal without murmurs. ABDOMEN: Soft, non-tender, non-distended, without organomegaly, normal bowel sounds. EXTREMITIES: No cyanosis or edema. NEUROLOGICAL: Cranial nerves grossly intact, moves all extremities without gross motor or sensory deficit.  Results for orders placed or performed in visit on 01/08/24  Hemoglobin A1c   Collection Time: 01/08/24  9:12 AM  Result Value Ref Range   Hgb A1c MFr Bld 5.8 (H)  4.8 - 5.6 %   Est. average glucose Bld gHb Est-mCnc 120 mg/dL  AFE1+ZHQM   Collection Time: 01/08/24  9:12 AM  Result Value Ref Range   Glucose 86 70 - 99 mg/dL   BUN 13 6 - 20 mg/dL   Creatinine, Ser 9.35 0.57 - 1.00 mg/dL   eGFR 876 >40 fO/fpw/8.26   BUN/Creatinine Ratio 20 9 - 23   Sodium 138 134 - 144 mmol/L   Potassium 4.3 3.5 - 5.2 mmol/L   Chloride 102 96 - 106 mmol/L   CO2 22 20 - 29 mmol/L   Calcium 9.5 8.7 - 10.2 mg/dL  CBC   Collection Time: 01/08/24  9:12 AM  Result Value Ref Range   WBC 6.4 3.4 - 10.8 x10E3/uL   RBC 4.60 3.77 - 5.28 x10E6/uL   Hemoglobin 13.3 11.1 - 15.9 g/dL   Hematocrit 59.9 65.9 - 46.6 %   MCV 87 79 - 97 fL   MCH 28.9 26.6 - 33.0 pg   MCHC 33.3 31.5 -  35.7 g/dL   RDW 87.5 88.2 - 84.5 %   Platelets 334 150 - 450 x10E3/uL  Lipid panel   Collection Time: 01/08/24  9:12 AM  Result Value Ref Range   Cholesterol, Total 155 100 - 199 mg/dL   Triglycerides 84 0 - 149 mg/dL   HDL 45 >60 mg/dL   VLDL Cholesterol Cal 16 5 - 40 mg/dL   LDL Chol Calc (NIH) 94 0 - 99 mg/dL   Chol/HDL Ratio 3.4 0.0 - 4.4 ratio          Assessment & Plan:   Problem List Items Addressed This Visit   None Visit Diagnoses       Bronchitis    -  Primary   Relevant Medications   chlorpheniramine-HYDROcodone (TUSSIONEX) 10-8 MG/5ML   predniSONE  (DELTASONE ) 10 MG tablet   albuterol  (VENTOLIN  HFA) 108 (90 Base) MCG/ACT inhaler        Assessment and Plan Assessment & Plan Bronchitis Persistent cough for over two weeks, initially productive with green mucus and congestion. No fever or body aches. Previous treatment with Z-Pak for acute sinusitis. Current symptoms include a wet cough with difficulty expectorating mucus. No shortness of breath or ear pain. Sinuses feel better. Bronchitis. - Prescribed albuterol  inhaler for use as needed. - Prescribed a steroid taper to be started the following day to avoid insomnia. - Recommended adding plain Mucinex to help with mucus. - Advised avoiding dairy to prevent thickening of mucus. - Encouraged increased water intake. - Prescribed tussinex cough syrup for nighttime use, cautioning it may cause drowsiness. - Advised against taking Nyquil with the cough syrup. - Instructed to report any changes in breathing or symptoms for potential chest x-ray.        Follow up plan: Return if symptoms worsen or fail to improve.

## 2024-08-27 ENCOUNTER — Other Ambulatory Visit: Payer: Self-pay

## 2024-08-27 MED ORDER — AIRSUPRA 90-80 MCG/ACT IN AERO
2.0000 | INHALATION_SPRAY | Freq: Four times a day (QID) | RESPIRATORY_TRACT | 1 refills | Status: AC | PRN
  Filled 2024-08-27: qty 10.7, 30d supply, fill #0

## 2024-08-30 ENCOUNTER — Ambulatory Visit: Admitting: Obstetrics & Gynecology

## 2024-09-06 ENCOUNTER — Other Ambulatory Visit (HOSPITAL_COMMUNITY)
Admission: RE | Admit: 2024-09-06 | Discharge: 2024-09-06 | Disposition: A | Discharge status: Home / Self Care | Source: Ambulatory Visit | Attending: Obstetrics & Gynecology | Admitting: Obstetrics & Gynecology

## 2024-09-06 ENCOUNTER — Ambulatory Visit (INDEPENDENT_AMBULATORY_CARE_PROVIDER_SITE_OTHER): Admitting: Obstetrics & Gynecology

## 2024-09-06 ENCOUNTER — Encounter: Payer: Self-pay | Admitting: Obstetrics & Gynecology

## 2024-09-06 VITALS — BP 114/78 | HR 103 | Ht 63.0 in | Wt 167.8 lb

## 2024-09-06 DIAGNOSIS — Z124 Encounter for screening for malignant neoplasm of cervix: Secondary | ICD-10-CM

## 2024-09-06 DIAGNOSIS — Z113 Encounter for screening for infections with a predominantly sexual mode of transmission: Secondary | ICD-10-CM

## 2024-09-06 DIAGNOSIS — Z01419 Encounter for gynecological examination (general) (routine) without abnormal findings: Secondary | ICD-10-CM

## 2024-09-06 NOTE — Progress Notes (Signed)
 "   GYNECOLOGY ANNUAL PHYSICAL EXAM PROGRESS NOTE  Subjective:    Kristina [name] is a 28 y.o. separated G1P1001 (2 yo daughter Sharyne) who presents for an annual exam.  The patient is not currently sexually active. The patient participates in regular exercise: no. Has the patient ever been transfused or tattooed?: no. The patient reports that there is not domestic violence in her life.   She has been separated for about a month and has been abstinent since then. She would like cervical cultures and a pap but declines blood work.  The patient has the following complaints today: No gyn problems  Menstrual History: Menarche age: 28 Patient's last menstrual period was 08/20/2024.     Gynecologic History:  Contraception: abstinence History of STI's:  Last Pap: 2022. Results were: normal.  Denies h/o abnormal pap smears.  Fh- no breast, gyn, colon cancer      OB History  Gravida Para Term Preterm AB Living  1 1 1  0 0 1  SAB IAB Ectopic Multiple Live Births  0 0 0 0 1    # Outcome Date GA Lbr Len/2nd Weight Sex Type Anes PTL Lv  1 Term 12/28/21    F Vag-Vacuum   LIV     Complications: Intraamniotic Infection    Past Medical History:  Diagnosis Date   Allergy    Pollen   GERD (gastroesophageal reflux disease)    No pertinent past medical history     Past Surgical History:  Procedure Laterality Date   WISDOM TOOTH EXTRACTION      Family History  Problem Relation Age of Onset   Hypertension Mother     Social History   Socioeconomic History   Marital status: Married    Spouse name: Ozzy   Number of children: 1   Years of education: Not on file   Highest education level: Associate degree: academic program  Occupational History    Employer: [employer]  Tobacco Use   Smoking status: Never   Smokeless tobacco: Never  Vaping Use   Vaping status: Never Used  Substance and Sexual Activity   Alcohol use: Not Currently   Drug use: Never   Sexual  activity: Not Currently    Partners: Male    Birth control/protection: None  Other Topics Concern   Not on file  Social History Narrative   Not on file   Social Drivers of Health   Tobacco Use: Low Risk (01/10/2024)   Patient History    Smoking Tobacco Use: Never    Smokeless Tobacco Use: Never    Passive Exposure: Not on file  Financial Resource Strain: Low Risk (08/12/2024)   Overall Financial Resource Strain (CARDIA)    Difficulty of Paying Living Expenses: Not hard at all  Food Insecurity: No Food Insecurity (08/12/2024)   Epic    Worried About Radiation Protection Practitioner of Food in the Last Year: Never true    Ran Out of Food in the Last Year: Never true  Transportation Needs: No Transportation Needs (08/12/2024)   Epic    Lack of Transportation (Medical): No    Lack of Transportation (Non-Medical): No  Physical Activity: Inactive (08/12/2024)   Exercise Vital Sign    Days of Exercise per Week: 0 days    Minutes of Exercise per Session: Not on file  Stress: Stress Concern Present (08/12/2024)   [employer] of Occupational Health - Occupational Stress Questionnaire    Feeling of Stress: Rather much  Social Connections: Moderately Integrated (  08/12/2024)   Social Connection and Isolation Panel    Frequency of Communication with Friends and Family: More than three times a week    Frequency of Social Gatherings with Friends and Family: More than three times a week    Attends Religious Services: 1 to 4 times per year    Active Member of [employer] or Organizations: No    Attends Banker Meetings: Not on file    Marital Status: Married  Catering Manager Violence: Not At Risk (01/08/2024)   Humiliation, Afraid, Rape, and Kick questionnaire    Fear of Current or Ex-Partner: No    Emotionally Abused: No    Physically Abused: No    Sexually Abused: No  Depression (PHQ2-9): Low Risk (02/24/2023)   Depression (PHQ2-9)    PHQ-2 Score: 0  Alcohol Screen: Low Risk (08/12/2024)   Alcohol  Screen    Last Alcohol Screening Score (AUDIT): 1  Housing: Unknown (08/12/2024)   Epic    Unable to Pay for Housing in the Last Year: No    Number of Times Moved in the Last Year: Not on file    Homeless in the Last Year: No  Utilities: Not At Risk (01/08/2024)   [facility] Utilities    Threatened with loss of utilities: No  Health Literacy: Adequate Health Literacy (01/08/2024)   B1300 Health Literacy    Frequency of need for help with medical instructions: Never    Medications Ordered Prior to Encounter[1]  Allergies[2]   Review of Systems Constitutional: negative for chills, fatigue, fevers and sweats Eyes: negative for irritation, redness and visual disturbance Ears, nose, mouth, throat, and face: negative for hearing loss, nasal congestion, snoring and tinnitus Respiratory: negative for asthma, cough, sputum Cardiovascular: negative for chest pain, dyspnea, exertional chest pressure/discomfort, irregular heart beat, palpitations and syncope Gastrointestinal: negative for abdominal pain, change in bowel habits, nausea and vomiting Genitourinary: negative for abnormal menstrual periods, genital lesions, sexual problems and vaginal discharge, dysuria and urinary incontinence Integument/breast: negative for breast lump, breast tenderness and nipple discharge Hematologic/lymphatic: negative for bleeding and easy bruising Musculoskeletal:negative for back pain and muscle weakness Neurological: negative for dizziness, headaches, vertigo and weakness Endocrine: negative for diabetic symptoms including polydipsia, polyuria and skin dryness Allergic/Immunologic: negative for hay fever and urticaria      Objective:  Blood pressure 114/78, pulse (!) 103, height 5' 3 (1.6 m), weight 167 lb 12.8 oz (76.1 kg), last menstrual period 08/20/2024. Body mass index is 29.72 kg/m.    General Appearance:    Alert, cooperative, no distress, appears stated age  Head:    Normocephalic, without obvious  abnormality, atraumatic  Eyes:    PERRL, conjunctiva/corneas clear, EOM's intact, both eyes  Ears:    Normal external ear canals, both ears  Nose:   Nares normal, septum midline, mucosa normal, no drainage or sinus tenderness  Throat:   Lips, mucosa, and tongue normal; teeth and gums normal  Neck:   Supple, symmetrical, trachea midline, no adenopathy; thyroid: no enlargement/tenderness/nodules; no carotid bruit or JVD  Back:     Symmetric, no curvature, ROM normal, no CVA tenderness  Lungs:     Clear to auscultation bilaterally, respirations unlabored  Chest Wall:    No tenderness or deformity   Heart:    Regular rate and rhythm, S1 and S2 normal, no murmur, rub or gallop  Breast Exam:    No tenderness, masses, or nipple abnormality  Abdomen:     Soft, non-tender, bowel sounds active all  four quadrants, no masses, no organomegaly.    Genitalia:    Pelvic:external genitalia normal, vagina without lesions, discharge, or tenderness, Cervix normal in appearance, no cervical motion tenderness, no adnexal masses or tenderness.  Uterus normal size, shape, mobile, retroverted, regular contours, nontender.     Extremities:   Extremities normal, atraumatic, no cyanosis or edema  Pulses:   2+ and symmetric all extremities  Skin:   Skin color, texture, turgor normal, no rashes or lesions  Lymph nodes:   Cervical, supraclavicular, and axillary nodes normal  Neurologic:   CNII-XII intact, normal strength, sensation and reflexes throughout   .  Labs:  Lab Results  Component Value Date   WBC 6.4 01/08/2024   HGB 13.3 01/08/2024   HCT 40.0 01/08/2024   MCV 87 01/08/2024   PLT 334 01/08/2024    Lab Results  Component Value Date   CREATININE 0.64 01/08/2024   BUN 13 01/08/2024   NA 138 01/08/2024   K 4.3 01/08/2024   CL 102 01/08/2024   CO2 22 01/08/2024    Lab Results  Component Value Date   ALT 26 01/02/2023   AST 21 01/02/2023   ALKPHOS 111 01/02/2023   BILITOT 0.2 01/02/2023    Lab  Results  Component Value Date   TSH 1.420 01/02/2023     Assessment:   1. Well woman exam with routine gynecological exam      Plan:   Breast self exam technique reviewed and patient encouraged to perform self-exam monthly. Contraception: abstinence. Discussed healthy lifestyle modifications, including working on her A1C.  Pap smear ordered. Cervical cultures Follow up in 1 year for annual exam   Starla Harland BROCKS, MD [facility]     [1]  Current Outpatient Medications on File Prior to Visit  Medication Sig Dispense Refill   albuterol  (VENTOLIN  HFA) 108 (90 Base) MCG/ACT inhaler Inhale 2 puffs into the lungs every 6 (six) hours as needed for wheezing or shortness of breath. (Patient not taking: Reported on 09/06/2024) 6.7 g 0   Albuterol -Budesonide  (AIRSUPRA ) 90-80 MCG/ACT AERO Inhale 2 puffs into the lungs 4 (four) times daily as needed. (Patient not taking: Reported on 09/06/2024) 10.7 g 1   benzonatate  (TESSALON ) 100 MG capsule Take 1 capsule (100 mg total) by mouth 2 (two) times daily as needed for cough. (Patient not taking: Reported on 09/06/2024) 20 capsule 0   chlorpheniramine-HYDROcodone (TUSSIONEX) 10-8 MG/5ML Take 5 mLs by mouth every 12 (twelve) hours as needed for cough (cough, will cause drowsiness.). (Patient not taking: Reported on 09/06/2024) 120 mL 0   levocetirizine (XYZAL ) 5 MG tablet Take 1 tablet (5 mg total) by mouth 2 (two) times daily. (Patient not taking: Reported on 09/06/2024) 60 tablet 6   No current facility-administered medications on file prior to visit.  [2] No Known Allergies  "

## 2024-09-07 ENCOUNTER — Other Ambulatory Visit: Payer: Self-pay

## 2024-09-08 LAB — CYTOLOGY - PAP
Chlamydia: NEGATIVE
Comment: NEGATIVE
Comment: NORMAL
Diagnosis: NEGATIVE
Neisseria Gonorrhea: NEGATIVE

## 2024-10-04 ENCOUNTER — Ambulatory Visit: Admitting: Obstetrics & Gynecology

## 2025-01-13 ENCOUNTER — Encounter: Admitting: Family Medicine
